# Patient Record
Sex: Female | Born: 1985 | Race: Black or African American | Hispanic: No | Marital: Single | Smoking: Former smoker
Health system: Southern US, Community
[De-identification: ages and names within clinical notes are randomized; demographics above are authoritative.]

## PROBLEM LIST (undated history)

## (undated) ENCOUNTER — Inpatient Hospital Stay (HOSPITAL_COMMUNITY): Payer: Self-pay

## (undated) DIAGNOSIS — G35 Multiple sclerosis: Secondary | ICD-10-CM

## (undated) DIAGNOSIS — N390 Urinary tract infection, site not specified: Secondary | ICD-10-CM

## (undated) DIAGNOSIS — A599 Trichomoniasis, unspecified: Secondary | ICD-10-CM

## (undated) DIAGNOSIS — K219 Gastro-esophageal reflux disease without esophagitis: Secondary | ICD-10-CM

## (undated) HISTORY — PX: TONSILLECTOMY: SUR1361

## (undated) HISTORY — DX: Multiple sclerosis: G35

## (undated) HISTORY — PX: ADENOIDECTOMY: SUR15

---

## 2005-03-21 ENCOUNTER — Ambulatory Visit: Payer: Self-pay | Admitting: Obstetrics and Gynecology

## 2008-07-27 ENCOUNTER — Emergency Department (HOSPITAL_COMMUNITY): Admission: EM | Admit: 2008-07-27 | Discharge: 2008-07-27 | Payer: Self-pay | Admitting: Family Medicine

## 2010-03-11 ENCOUNTER — Encounter: Payer: Self-pay | Admitting: *Deleted

## 2010-07-06 NOTE — Group Therapy Note (Signed)
NAME:  Anita [name], Anita [name] NO.:  [id number]   MEDICAL RECORD NO.:  [id number]          PATIENT TYPE:  WOC   LOCATION:  [facility]                   FACILITY:  [facility]   PHYSICIAN:  Argentina Donovan, MD        DATE OF BIRTH:  [birth year]   DATE OF SERVICE:                                    CLINIC NOTE   The patient is a 25 year old nulligravida black female weighing 265 pounds,  5 feet 6 inches tall, whose chief complaint is for somewhat of an irregular  period although her cycle sounds like it is more of a 35-day variance in  cycle and dyspareunia especially within two weeks of her period.  We have  discussed the possible causes of both of these things and I told her to keep  a very good record of her periods, as well as the episodes of painful  intercourse.   EXAMINATION:  External genitalia is normal.  BUS within normal limits.  The  vagina is clean and well rugated.  The cervix is anterior, clean and  nulliparous, and the uterus is retroverted and of normal size, shape and  consistency.  The adnexa could not be well outlined because of habitus of  the patient.   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   PLAN:  Positional change using a menstrual calendar and marking the dates of  pain, and suppressing ovulation with oral contraceptives although we may  have to change the oral contraceptive because of the habitus of the patient.           ______________________________  Argentina Donovan, MD     PR/MEDQ  D:  03/21/2005  T:  03/21/2005  Job:  [id number]

## 2011-11-20 ENCOUNTER — Encounter (HOSPITAL_COMMUNITY): Payer: Self-pay | Admitting: *Deleted

## 2011-11-20 ENCOUNTER — Emergency Department (HOSPITAL_COMMUNITY)
Admission: EM | Admit: 2011-11-20 | Discharge: 2011-11-20 | Disposition: A | Discharge status: Home / Self Care | Payer: Self-pay | Attending: Emergency Medicine | Admitting: Emergency Medicine

## 2011-11-20 DIAGNOSIS — K219 Gastro-esophageal reflux disease without esophagitis: Secondary | ICD-10-CM

## 2011-11-20 DIAGNOSIS — R197 Diarrhea, unspecified: Secondary | ICD-10-CM | POA: Insufficient documentation

## 2011-11-20 DIAGNOSIS — R1013 Epigastric pain: Secondary | ICD-10-CM | POA: Insufficient documentation

## 2011-11-20 DIAGNOSIS — K529 Noninfective gastroenteritis and colitis, unspecified: Secondary | ICD-10-CM

## 2011-11-20 DIAGNOSIS — R112 Nausea with vomiting, unspecified: Secondary | ICD-10-CM | POA: Insufficient documentation

## 2011-11-20 DIAGNOSIS — R509 Fever, unspecified: Secondary | ICD-10-CM | POA: Insufficient documentation

## 2011-11-20 LAB — URINALYSIS, MICROSCOPIC ONLY
Bilirubin Urine: NEGATIVE
Nitrite: NEGATIVE
Specific Gravity, Urine: 1.026 (ref 1.005–1.030)
pH: 5.5 (ref 5.0–8.0)

## 2011-11-20 LAB — CBC WITH DIFFERENTIAL/PLATELET
Basophils Relative: 0 % (ref 0–1)
Eosinophils Absolute: 0.4 10*3/uL (ref 0.0–0.7)
MCH: 22.5 pg — ABNORMAL LOW (ref 26.0–34.0)
MCHC: 31.9 g/dL (ref 30.0–36.0)
Monocytes Relative: 7 % (ref 3–12)
Neutrophils Relative %: 51 % (ref 43–77)
Platelets: 342 10*3/uL (ref 150–400)
RDW: 15.1 % (ref 11.5–15.5)

## 2011-11-20 LAB — COMPREHENSIVE METABOLIC PANEL
Albumin: 3 g/dL — ABNORMAL LOW (ref 3.5–5.2)
Alkaline Phosphatase: 59 U/L (ref 39–117)
BUN: 11 mg/dL (ref 6–23)
Potassium: 3.8 mEq/L (ref 3.5–5.1)
Sodium: 138 mEq/L (ref 135–145)
Total Protein: 6.1 g/dL (ref 6.0–8.3)

## 2011-11-20 LAB — LIPASE, BLOOD: Lipase: 34 U/L (ref 11–59)

## 2011-11-20 MED ORDER — ONDANSETRON HCL 4 MG PO TABS: 4.0000 mg | ORAL_TABLET | Freq: Four times a day (QID) | ORAL | Status: DC

## 2011-11-20 MED ORDER — ONDANSETRON 8 MG PO TBDP
8.0000 mg | ORAL_TABLET | Freq: Once | ORAL | Status: AC
  Administered 2011-11-20: 8 mg via ORAL
  Filled 2011-11-20: qty 1

## 2011-11-20 MED ORDER — RANITIDINE HCL 150 MG PO CAPS: 150.0000 mg | ORAL_CAPSULE | Freq: Every day | ORAL | Status: DC

## 2011-11-20 NOTE — ED Provider Notes (Signed)
History     CSN: [id number]  Arrival date & time 11/20/11  1844   First MD Initiated Contact with Patient 11/20/11 2101      Chief Complaint  Patient presents with  . Nausea  . Emesis  . Diarrhea  . Abdominal Cramping    (Consider location/radiation/quality/duration/timing/severity/associated sxs/prior treatment) Patient is a 26 y.o. female presenting with vomiting, diarrhea, and cramps. The history is provided by the patient.  Emesis  This is a new problem. The current episode started 2 days ago. The problem occurs 2 to 4 times per day. The problem has been resolved. The emesis has an appearance of stomach contents. There has been no fever. Associated symptoms include chills and diarrhea. Pertinent negatives include no cough, no fever and no URI. Risk factors: possible ill contacts.  Diarrhea The primary symptoms include nausea, vomiting and diarrhea. Primary symptoms do not include fever. The illness began 2 days ago. The onset was sudden. The problem has been gradually improving.  The diarrhea is watery. The diarrhea occurs 2 to 4 times per day.  The illness is also significant for chills and anorexia. The illness does not include constipation or back pain. Significant associated medical issues include GERD.  Abdominal Cramping The primary symptoms of the illness include nausea, vomiting and diarrhea. The primary symptoms of the illness do not include fever.  Additional symptoms associated with the illness include chills and anorexia. Symptoms associated with the illness do not include constipation or back pain. Significant associated medical issues include GERD.    History reviewed. No pertinent past medical history.  Past Surgical History  Procedure Date  . Tonsillectomy   . Adenoidectomy     No family history on file.  History  Substance Use Topics  . Smoking status: Current Some Day Smoker  . Smokeless tobacco: Not on file  . Alcohol Use: No    OB History    Grav Para Term Preterm Abortions TAB SAB Ect Mult Living                  Review of Systems  Constitutional: Positive for chills. Negative for fever.  Respiratory: Negative for cough.   Gastrointestinal: Positive for nausea, vomiting, diarrhea and anorexia. Negative for constipation.  Musculoskeletal: Negative for back pain.  All other systems reviewed and are negative.    Allergies  Amoxicillin  Home Medications  No current outpatient prescriptions on file.  BP 106/60  Pulse 78  Temp 99.1 F (37.3 C) (Oral)  Resp 18  SpO2 100%  LMP 10/30/2011  Physical Exam  Nursing note and vitals reviewed. Constitutional: She is oriented to person, place, and time. She appears well-developed and well-nourished. No distress.  HENT:  Head: Normocephalic and atraumatic.  Mouth/Throat: Oropharynx is clear and moist.  Eyes: Conjunctivae normal and EOM are normal. Pupils are equal, round, and reactive to light.  Neck: Normal range of motion. Neck supple.  Cardiovascular: Normal rate, regular rhythm and intact distal pulses.   No murmur heard. Pulmonary/Chest: Effort normal and breath sounds normal. No respiratory distress. She has no wheezes. She has no rales.  Abdominal: Soft. She exhibits no distension. There is tenderness in the epigastric area. There is no rebound and no guarding.       Mild epigastric pain  Musculoskeletal: Normal range of motion. She exhibits no edema and no tenderness.  Neurological: She is alert and oriented to person, place, and time.  Skin: Skin is warm and dry. No rash noted. No erythema.  Psychiatric: She has a normal mood and affect. Her behavior is normal.    ED Course  Procedures (including critical care time)  Labs Reviewed  CBC WITH DIFFERENTIAL - Abnormal; Notable for the following:    Hemoglobin 11.3 (*)     HCT 35.4 (*)     MCV 70.5 (*)     MCH 22.5 (*)     All other components within normal limits  COMPREHENSIVE METABOLIC PANEL - Abnormal;  Notable for the following:    Albumin 3.0 (*)     Total Bilirubin 0.1 (*)     GFR calc non Af Amer 87 (*)     All other components within normal limits  URINALYSIS, MICROSCOPIC ONLY - Abnormal; Notable for the following:    APPearance CLOUDY (*)     Leukocytes, UA SMALL (*)     Bacteria, UA FEW (*)     Squamous Epithelial / LPF MANY (*)     All other components within normal limits  LIPASE, BLOOD  PREGNANCY, URINE   No results found.   No diagnosis found.    MDM   Pt with symptoms most consistent with a viral process with fever/vomitting/diarrhea.  Denies bad food exposure and recent travel out of the country.  No recent abx.  No hx concerning for GU pathology or kidney stones.  Pt is awake and alert on exam without peritoneal signs.  Labs all wnl.  Pt not c/o of UTI sx.  Pt wanted to try po zofran and will fluid challenge.  Also pt c/o of sx consistent with reflux and states has had it before but worse since this illness.  Will have her take a 2 week course of zantac.  10:29 PM Pt feeling much better and d/ced home.      Gwyneth Sprout, MD 11/20/11 2229

## 2011-11-20 NOTE — ED Notes (Signed)
Patient reports N/V starting Monday night.  Pt reports vomiting x1 late Monday night. Pt reports yesterday evening started to have N/V/D.  Pt also reports acid reflux. Pt has reports symptoms have improved today and she has been able to keep fluids down. Pt reports ginger ale seems to improve her stomach discomfort. Pt not actively vomiting in triage,

## 2012-02-10 ENCOUNTER — Emergency Department (HOSPITAL_COMMUNITY): Payer: Self-pay

## 2012-02-10 ENCOUNTER — Emergency Department (HOSPITAL_COMMUNITY)
Admission: EM | Admit: 2012-02-10 | Discharge: 2012-02-11 | Disposition: A | Discharge status: Home / Self Care | Payer: Self-pay | Attending: Emergency Medicine | Admitting: Emergency Medicine

## 2012-02-10 ENCOUNTER — Encounter (HOSPITAL_COMMUNITY): Payer: Self-pay | Admitting: Emergency Medicine

## 2012-02-10 DIAGNOSIS — Z8669 Personal history of other diseases of the nervous system and sense organs: Secondary | ICD-10-CM | POA: Insufficient documentation

## 2012-02-10 DIAGNOSIS — R51 Headache: Secondary | ICD-10-CM | POA: Insufficient documentation

## 2012-02-10 DIAGNOSIS — F17200 Nicotine dependence, unspecified, uncomplicated: Secondary | ICD-10-CM | POA: Insufficient documentation

## 2012-02-10 DIAGNOSIS — Z79899 Other long term (current) drug therapy: Secondary | ICD-10-CM | POA: Insufficient documentation

## 2012-02-10 DIAGNOSIS — H539 Unspecified visual disturbance: Secondary | ICD-10-CM | POA: Insufficient documentation

## 2012-02-10 LAB — CSF CELL COUNT WITH DIFFERENTIAL
RBC Count, CSF: 1 /mm3 — ABNORMAL HIGH
Tube #: 4
WBC, CSF: 2 /mm3 (ref 0–5)
WBC, CSF: 3 /mm3 (ref 0–5)

## 2012-02-10 LAB — GRAM STAIN

## 2012-02-10 LAB — CBC WITH DIFFERENTIAL/PLATELET
Basophils Relative: 1 % (ref 0–1)
Eosinophils Absolute: 0.3 10*3/uL (ref 0.0–0.7)
MCH: 22.3 pg — ABNORMAL LOW (ref 26.0–34.0)
MCHC: 31.9 g/dL (ref 30.0–36.0)
Neutrophils Relative %: 52 % (ref 43–77)
Platelets: 371 10*3/uL (ref 150–400)
RDW: 15.2 % (ref 11.5–15.5)

## 2012-02-10 LAB — BASIC METABOLIC PANEL
Calcium: 8.1 mg/dL — ABNORMAL LOW (ref 8.4–10.5)
Creatinine, Ser: 0.9 mg/dL (ref 0.50–1.10)
GFR calc non Af Amer: 86 mL/min — ABNORMAL LOW (ref >90)
Sodium: 134 mEq/L — ABNORMAL LOW (ref 135–145)

## 2012-02-10 LAB — PROTEIN AND GLUCOSE, CSF: Glucose, CSF: 56 mg/dL (ref 43–76)

## 2012-02-10 MED ORDER — IOHEXOL 300 MG/ML  SOLN
100.0000 mL | Freq: Once | INTRAMUSCULAR | Status: AC | PRN
  Administered 2012-02-10: 80 mL via INTRAVENOUS

## 2012-02-10 NOTE — ED Notes (Signed)
Patient transported to X-ray for lumbar puncture  

## 2012-02-10 NOTE — ED Notes (Signed)
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

## 2012-02-10 NOTE — Procedures (Signed)
LP under fluoro.  OP 10 cm of water.  Fluid clear without hemorrhage or xanthochromia.

## 2012-02-11 NOTE — ED Provider Notes (Signed)
History     CSN: [id number]  Arrival date & time 02/10/12  8657   First MD Initiated Contact with Patient 02/10/12 1902      Chief Complaint  Patient presents with  . vision changes     (Consider location/radiation/quality/duration/timing/severity/associated sxs/prior treatment) Patient is a 26 y.o. female presenting with headaches.  Headache  This is a new problem. The current episode started 2 days ago. The problem has been gradually improving. The pain is located in the left unilateral region. The quality of the pain is described as throbbing. The pain is mild. The pain does not radiate. She has tried nothing for the symptoms.  Patient with history of pseudotumor cerebri, seen previously several years ago at [facility]  Two days ago felt a "pop" behind left eye with headache and left visual deficit (described as field of vision being "dark" out of left eye).  History reviewed. No pertinent past medical history.  Past Surgical History  Procedure Date  . Tonsillectomy   . Adenoidectomy     No family history on file.  History  Substance Use Topics  . Smoking status: Current Some Day Smoker  . Smokeless tobacco: Not on file  . Alcohol Use: No    OB History    Grav Para Term Preterm Abortions TAB SAB Ect Mult Living                  Review of Systems  HENT: Negative for neck stiffness.   Eyes: Positive for visual disturbance.  Neurological: Positive for headaches. Negative for speech difficulty.  All other systems reviewed and are negative.    Allergies  Amoxicillin  Home Medications   Current Outpatient Rx  Name  Route  Sig  Dispense  Refill  . ONDANSETRON HCL 4 MG PO TABS   Oral   Take 1 tablet (4 mg total) by mouth every 6 (six) hours.   12 tablet   0   . RANITIDINE HCL 150 MG PO CAPS   Oral   Take 1 capsule (150 mg total) by mouth daily.   28 capsule   0     BP 118/68  Pulse 64  Temp 98.5 F (36.9 C) (Oral)  Resp 16  SpO2 100%  LMP  01/26/2012  Physical Exam  Nursing note and vitals reviewed. Constitutional: She is oriented to person, place, and time. She appears well-developed and well-nourished.  HENT:  Head: Normocephalic and atraumatic.  Eyes: Conjunctivae normal and EOM are normal. Pupils are equal, round, and reactive to light.  Fundoscopic exam:      The left eye shows no exudate, no hemorrhage and no papilledema. The left eye shows red reflex. Neck: Normal range of motion. Neck supple.  Cardiovascular: Normal rate, regular rhythm and normal heart sounds.   Pulmonary/Chest: Effort normal and breath sounds normal.  Abdominal: Soft. Bowel sounds are normal.  Musculoskeletal: Normal range of motion.  Neurological: She is alert and oriented to person, place, and time. No cranial nerve deficit.  Skin: Skin is warm and dry.  Psychiatric: She has a normal mood and affect. Her behavior is normal. Judgment and thought content normal.    ED Course  Procedures (including critical care time)   Labs Reviewed  CBC WITH DIFFERENTIAL - Abnormal; Notable for the following:    RBC 5.48 (*)     MCV 69.9 (*)     MCH 22.3 (*)     All other components within normal limits  BASIC METABOLIC PANEL -  Abnormal; Notable for the following:    Sodium 134 (*)     Calcium 8.1 (*)     GFR calc non Af Amer 86 (*)     All other components within normal limits  CSF CELL COUNT WITH DIFFERENTIAL - Abnormal; Notable for the following:    RBC Count, CSF 2 (*)     All other components within normal limits  CSF CELL COUNT WITH DIFFERENTIAL - Abnormal; Notable for the following:    RBC Count, CSF 1 (*)     All other components within normal limits  PROTEIN AND GLUCOSE, CSF  GRAM STAIN  CSF CULTURE   Ct Head W Wo Contrast  02/10/2012  *RADIOLOGY REPORT*  Clinical Data: Headache with visual disturbance.  History of pseudotumor cerebral.  CT HEAD WITHOUT AND WITH CONTRAST  Technique:  Contiguous axial images were obtained from the base  of the skull through the vertex without and with intravenous contrast.  Contrast: 80mL OMNIPAQUE IOHEXOL 300 MG/ML  SOLN  Comparison: None.  Findings: There is no evidence for acute infarction, intracranial hemorrhage, mass lesion, hydrocephalus, or extra-axial fluid. There is no atrophy or white matter disease.  Following administration of contrast, there is no abnormal enhancement.  Calvarium intact.  Sinuses and mastoids clear.  IMPRESSION: Negative CT of the head without and with contrast.   Original Report Authenticated By: Davonna Belling, M.D.    Dg Fluoro Guide Lumbar Puncture  02/10/2012  Elsie Stain, MD     02/10/2012 10:02 PM LP under fluoro.  OP 10 cm of water.  Fluid clear without  hemorrhage or xanthochromia.   Discussed with Dr. Manus Gunning.  Recommends CT of head, LP, with concern for [facility].  Lab and radiology results reviewed, discussed with Dr. Manus Gunning, shared with patient.  1. Headache behind the eyes       MDM          Jimmye Norman, NP 02/11/12 0110

## 2012-02-11 NOTE — ED Provider Notes (Signed)
Medical screening examination/treatment/procedure(s) were conducted as a shared visit with non-physician practitioner(s) and myself.  I personally evaluated the patient during the encounter  Hx pseudotumor.  Felt "pop" behind L eye 2 days ago with severe headache. Feels like L eye vision is "darker".  Headache has improved. CN 2-12 intact, 5/5 strenght throughout, no ataxia on finger to nose, visual fields full confrontation.  Plan CT/LP with opening pressure.  Glynn Octave, MD 02/11/12 [phone]

## 2012-02-14 LAB — CSF CULTURE W GRAM STAIN
Gram Stain: NONE SEEN
Special Requests: NORMAL

## 2012-07-30 ENCOUNTER — Emergency Department (HOSPITAL_COMMUNITY)
Admission: EM | Admit: 2012-07-30 | Discharge: 2012-07-30 | Disposition: A | Discharge status: Home / Self Care | Payer: Medicaid Other | Attending: Emergency Medicine | Admitting: Emergency Medicine

## 2012-07-30 ENCOUNTER — Encounter (HOSPITAL_COMMUNITY): Payer: Self-pay | Admitting: Emergency Medicine

## 2012-07-30 DIAGNOSIS — O99330 Smoking (tobacco) complicating pregnancy, unspecified trimester: Secondary | ICD-10-CM | POA: Insufficient documentation

## 2012-07-30 DIAGNOSIS — R42 Dizziness and giddiness: Secondary | ICD-10-CM | POA: Insufficient documentation

## 2012-07-30 DIAGNOSIS — Z880 Allergy status to penicillin: Secondary | ICD-10-CM | POA: Insufficient documentation

## 2012-07-30 DIAGNOSIS — O9989 Other specified diseases and conditions complicating pregnancy, childbirth and the puerperium: Secondary | ICD-10-CM | POA: Insufficient documentation

## 2012-07-30 DIAGNOSIS — Z3201 Encounter for pregnancy test, result positive: Secondary | ICD-10-CM | POA: Insufficient documentation

## 2012-07-30 DIAGNOSIS — R5383 Other fatigue: Secondary | ICD-10-CM | POA: Insufficient documentation

## 2012-07-30 DIAGNOSIS — R110 Nausea: Secondary | ICD-10-CM

## 2012-07-30 DIAGNOSIS — Z8719 Personal history of other diseases of the digestive system: Secondary | ICD-10-CM | POA: Insufficient documentation

## 2012-07-30 DIAGNOSIS — R5381 Other malaise: Secondary | ICD-10-CM | POA: Insufficient documentation

## 2012-07-30 DIAGNOSIS — N390 Urinary tract infection, site not specified: Secondary | ICD-10-CM | POA: Insufficient documentation

## 2012-07-30 DIAGNOSIS — O2341 Unspecified infection of urinary tract in pregnancy, first trimester: Secondary | ICD-10-CM

## 2012-07-30 DIAGNOSIS — K5900 Constipation, unspecified: Secondary | ICD-10-CM | POA: Insufficient documentation

## 2012-07-30 DIAGNOSIS — O219 Vomiting of pregnancy, unspecified: Secondary | ICD-10-CM | POA: Insufficient documentation

## 2012-07-30 DIAGNOSIS — O2390 Unspecified genitourinary tract infection in pregnancy, unspecified trimester: Secondary | ICD-10-CM | POA: Insufficient documentation

## 2012-07-30 DIAGNOSIS — Z79899 Other long term (current) drug therapy: Secondary | ICD-10-CM | POA: Insufficient documentation

## 2012-07-30 HISTORY — DX: Gastro-esophageal reflux disease without esophagitis: K21.9

## 2012-07-30 LAB — POCT I-STAT, CHEM 8
Chloride: 102 mEq/L (ref 96–112)
HCT: 36 % (ref 36.0–46.0)
Hemoglobin: 12.2 g/dL (ref 12.0–15.0)
Potassium: 3.4 mEq/L — ABNORMAL LOW (ref 3.5–5.1)

## 2012-07-30 LAB — URINALYSIS, ROUTINE W REFLEX MICROSCOPIC
Glucose, UA: NEGATIVE mg/dL
Specific Gravity, Urine: 1.031 — ABNORMAL HIGH (ref 1.005–1.030)
Urobilinogen, UA: 1 mg/dL (ref 0.0–1.0)

## 2012-07-30 LAB — CBC WITH DIFFERENTIAL/PLATELET
Basophils Relative: 0 % (ref 0–1)
Eosinophils Relative: 1 % (ref 0–5)
HCT: 33.1 % — ABNORMAL LOW (ref 36.0–46.0)
Hemoglobin: 10.9 g/dL — ABNORMAL LOW (ref 12.0–15.0)
Lymphs Abs: 1.7 10*3/uL (ref 0.7–4.0)
MCH: 22.7 pg — ABNORMAL LOW (ref 26.0–34.0)
MCV: 68.8 fL — ABNORMAL LOW (ref 78.0–100.0)
Monocytes Absolute: 0.4 10*3/uL (ref 0.1–1.0)
Monocytes Relative: 6 % (ref 3–12)
Neutro Abs: 4 10*3/uL (ref 1.7–7.7)
RBC: 4.81 MIL/uL (ref 3.87–5.11)
WBC: 6.2 10*3/uL (ref 4.0–10.5)

## 2012-07-30 LAB — LIPASE, BLOOD: Lipase: 22 U/L (ref 11–59)

## 2012-07-30 LAB — COMPREHENSIVE METABOLIC PANEL
BUN: 4 mg/dL — ABNORMAL LOW (ref 6–23)
Calcium: 9 mg/dL (ref 8.4–10.5)
Creatinine, Ser: 0.6 mg/dL (ref 0.50–1.10)
GFR calc Af Amer: >90 mL/min (ref >90)
Glucose, Bld: 92 mg/dL (ref 70–99)
Total Protein: 7 g/dL (ref 6.0–8.3)

## 2012-07-30 LAB — URINE MICROSCOPIC-ADD ON

## 2012-07-30 LAB — HCG, QUANTITATIVE, PREGNANCY: hCG, Beta Chain, Quant, S: 107124 m[IU]/mL — ABNORMAL HIGH (ref <5)

## 2012-07-30 MED ORDER — ONDANSETRON 8 MG PO TBDP
8.0000 mg | ORAL_TABLET | Freq: Once | ORAL | Status: AC
  Administered 2012-07-30: 8 mg via ORAL
  Filled 2012-07-30: qty 1

## 2012-07-30 MED ORDER — SODIUM CHLORIDE 0.9 % IV BOLUS (SEPSIS)
1000.0000 mL | Freq: Once | INTRAVENOUS | Status: AC
  Administered 2012-07-30: 1000 mL via INTRAVENOUS

## 2012-07-30 MED ORDER — PRENATAL COMPLETE 14-0.4 MG PO TABS: 1.0000 | ORAL_TABLET | Freq: Every day | ORAL | Status: DC

## 2012-07-30 MED ORDER — PRENATAL MULTIVITAMIN CH
1.0000 | ORAL_TABLET | Freq: Every day | ORAL | Status: DC
  Filled 2012-07-30: qty 1

## 2012-07-30 MED ORDER — CEPHALEXIN 500 MG PO CAPS: 500.0000 mg | ORAL_CAPSULE | Freq: Two times a day (BID) | ORAL | Status: DC

## 2012-07-30 MED ORDER — LORAZEPAM 2 MG/ML IJ SOLN: 0.5000 mg | Freq: Once | INTRAMUSCULAR | Status: DC

## 2012-07-30 MED ORDER — ONDANSETRON 4 MG PO TBDP: 4.0000 mg | ORAL_TABLET | Freq: Three times a day (TID) | ORAL | Status: DC | PRN

## 2012-07-30 NOTE — Progress Notes (Signed)
[facility] has seen patient and provided her with a oc application and a list of primary care resources. °

## 2012-07-30 NOTE — ED Notes (Signed)
PA at bedside.

## 2012-07-30 NOTE — ED Provider Notes (Signed)
History     CSN: [id number]  Arrival date & time 07/30/12  1028   First MD Initiated Contact with Patient 07/30/12 1037      Chief Complaint  Patient presents with  . Dizziness  . Emesis    (Consider location/radiation/quality/duration/timing/severity/associated sxs/prior treatment) HPI Anita [name] is a 27 y.o. female who presents to ED with complaint of dizziness, nausea, vomiting, weakness. States symptoms began several weeks ago with nausea and intermittent constipation. States in the last few days began having dizziness. States works at a [employer] with no air conditioning. Thinks may be dehydrated. States also took pregnancy test at home which showed "a little bit of negative and a little bit of positive" result. State dizziness feels like "lightheadiness" independent of position. Denies cough, fever, chills, URI symptoms, denies chest pain or abdominal pain, no syncope.    Past Medical History  Diagnosis Date  . GERD (gastroesophageal reflux disease)     Past Surgical History  Procedure Laterality Date  . Tonsillectomy    . Adenoidectomy      No family history on file.  History  Substance Use Topics  . Smoking status: Current Some Day Smoker  . Smokeless tobacco: Not on file  . Alcohol Use: No    OB History   Grav Para Term Preterm Abortions TAB SAB Ect Mult Living                  Review of Systems  Constitutional: Positive for fatigue. Negative for fever and chills.  HENT: Negative for neck pain.   Respiratory: Negative.   Cardiovascular: Negative.   Gastrointestinal: Positive for nausea, vomiting and constipation. Negative for abdominal pain, diarrhea and blood in stool.  Genitourinary: Negative for dysuria, flank pain, vaginal bleeding and vaginal discharge.  Skin: Negative.   Neurological: Positive for dizziness, weakness and light-headedness. Negative for syncope and headaches.  All other systems reviewed and are  negative.    Allergies  Amoxicillin  Home Medications   Current Outpatient Rx  Name  Route  Sig  Dispense  Refill  . diphenhydrAMINE (BENADRYL) 25 MG tablet   Oral   Take 25 mg by mouth every 6 (six) hours as needed for sleep.         . Probiotic Product (PROBIOTIC & ACIDOPHILUS EX ST PO)   Oral   Take 1 capsule by mouth daily.           BP 120/91  Pulse 101  Temp(Src) 98.3 F (36.8 C) (Oral)  Resp 18  SpO2 100%  LMP 06/20/2012  Physical Exam  Nursing note and vitals reviewed. Constitutional: She is oriented to person, place, and time. She appears well-developed and well-nourished. No distress.  Eyes: Conjunctivae are normal.  Neck: Neck supple.  Cardiovascular: Normal rate, regular rhythm and normal heart sounds.   Pulmonary/Chest: Effort normal and breath sounds normal. No respiratory distress. She has no wheezes. She has no rales.  Abdominal: Soft. Bowel sounds are normal. She exhibits no distension. There is no tenderness. There is no rebound.  Musculoskeletal: She exhibits no edema.  Neurological: She is alert and oriented to person, place, and time.  Skin: Skin is warm and dry.  Psychiatric: She has a normal mood and affect.    ED Course  Procedures (including critical care time)  Pt with dizziness, nausea for several days. Problem with bowel movements. No abdominal pain. No urinary or vaginal complaints. No chest pain or sob. Will get labs, urine, preg  Results for orders placed during the hospital encounter of 07/30/12  URINALYSIS, ROUTINE W REFLEX MICROSCOPIC      Result Value Range   Color, Urine AMBER (*) YELLOW   APPearance CLOUDY (*) CLEAR   Specific Gravity, Urine 1.031 (*) 1.005 - 1.030   pH 5.5  5.0 - 8.0   Glucose, UA NEGATIVE  NEGATIVE mg/dL   Hgb urine dipstick SMALL (*) NEGATIVE   Bilirubin Urine SMALL (*) NEGATIVE   Ketones, ur 40 (*) NEGATIVE mg/dL   Protein, ur 30 (*) NEGATIVE mg/dL   Urobilinogen, UA 1.0  0.0 - 1.0 mg/dL    Nitrite POSITIVE (*) NEGATIVE   Leukocytes, UA MODERATE (*) NEGATIVE  CBC WITH DIFFERENTIAL      Result Value Range   WBC 6.2  4.0 - 10.5 K/uL   RBC 4.81  3.87 - 5.11 MIL/uL   Hemoglobin 10.9 (*) 12.0 - 15.0 g/dL   HCT 16.1 (*) 09.6 - 04.5 %   MCV 68.8 (*) 78.0 - 100.0 fL   MCH 22.7 (*) 26.0 - 34.0 pg   MCHC 32.9  30.0 - 36.0 g/dL   RDW 40.9  81.1 - 91.4 %   Platelets 345  150 - 400 K/uL   Neutrophils Relative % 66  43 - 77 %   Lymphocytes Relative 27  12 - 46 %   Monocytes Relative 6  3 - 12 %   Eosinophils Relative 1  0 - 5 %   Basophils Relative 0  0 - 1 %   Neutro Abs 4.0  1.7 - 7.7 K/uL   Lymphs Abs 1.7  0.7 - 4.0 K/uL   Monocytes Absolute 0.4  0.1 - 1.0 K/uL   Eosinophils Absolute 0.1  0.0 - 0.7 K/uL   Basophils Absolute 0.0  0.0 - 0.1 K/uL   Smear Review MORPHOLOGY UNREMARKABLE    COMPREHENSIVE METABOLIC PANEL      Result Value Range   Sodium 130 (*) 135 - 145 mEq/L   Potassium 3.3 (*) 3.5 - 5.1 mEq/L   Chloride 98  96 - 112 mEq/L   CO2 22  19 - 32 mEq/L   Glucose, Bld 92  70 - 99 mg/dL   BUN 4 (*) 6 - 23 mg/dL   Creatinine, Ser 7.82  0.50 - 1.10 mg/dL   Calcium 9.0  8.4 - 95.6 mg/dL   Total Protein 7.0  6.0 - 8.3 g/dL   Albumin 3.1 (*) 3.5 - 5.2 g/dL   AST 16  0 - 37 U/L   ALT 33  0 - 35 U/L   Alkaline Phosphatase 68  39 - 117 U/L   Total Bilirubin 0.2 (*) 0.3 - 1.2 mg/dL   GFR calc non Af Amer >90  >90 mL/min   GFR calc Af Amer >90  >90 mL/min  LIPASE, BLOOD      Result Value Range   Lipase 22  11 - 59 U/L  HCG, QUANTITATIVE, PREGNANCY      Result Value Range   hCG, Beta Chain, [place] Longest 213086 (*) <5 mIU/mL  URINE MICROSCOPIC-ADD ON      Result Value Range   Squamous Epithelial / LPF FEW (*) RARE   WBC, UA 7-10  <3 WBC/hpf   RBC / HPF 3-6  <3 RBC/hpf   Bacteria, UA MANY (*) RARE   Urine-Other MUCOUS PRESENT    POCT I-STAT, CHEM 8      Result Value Range   Sodium 135  135 - 145  mEq/L   Potassium 3.4 (*) 3.5 - 5.1 mEq/L   Chloride 102  96 - 112 mEq/L    BUN <3 (*) 6 - 23 mg/dL   Creatinine, Ser 4.09  0.50 - 1.10 mg/dL   Glucose, Bld 89  70 - 99 mg/dL   Calcium, Ion 8.11  9.14 - 1.23 mmol/L   TCO2 24  0 - 100 mmol/L   Hemoglobin 12.2  12.0 - 15.0 g/dL   HCT 78.2  95.6 - 21.3 %  POCT PREGNANCY, URINE      Result Value Range   Preg Test, Ur POSITIVE (*) NEGATIVE   No results found.    1. Positive pregnancy test   2. Nausea   3. Dizziness   4. UTI (urinary tract infection) during pregnancy, first trimester       MDM  Pt's urine and quant hcg back positive for pregnancy. Pt denies abdominal pain or vaginal discharge or bleeding. Do not think pelvic exam or [place] indicated at this time. Doubt any pregnancy related problems such as miscarriage or ectopic. Pt's last normal period was April 3rd which is exactly 10 wks ago, and correlates with hCG of 107,124. I do believe pt's nausea, weakness is due to her pregnancy. Her VS here are normal. UA shows infection and mild dehydration. Pt hydrated with 2L of IV fluids in ED. Home with keflex, zofran and prenatal vitamins. Follow up with GYN.  Filed Vitals:   07/30/12 1045 07/30/12 1103 07/30/12 1104 07/30/12 1106  BP: 125/73 113/58 118/81 120/91  Pulse: 77 76 82 101  Temp: 98.3 F (36.8 C)     TempSrc: Oral     Resp: 18     SpO2: 100%                Zela Sobieski A Kareli Hossain, PA-C 07/30/12 1345

## 2012-07-30 NOTE — ED Notes (Signed)
Pt here for c/o lightheaded/dizziness with one episode of emesis while at work, no bm in three days

## 2012-07-31 LAB — URINE CULTURE: Colony Count: 75000

## 2012-07-31 NOTE — ED Provider Notes (Signed)
Medical screening examination/treatment/procedure(s) were performed by non-physician practitioner and as supervising physician I was immediately available for consultation/collaboration.   Allissa Albright L Charnell Peplinski, MD 07/31/12 0917 

## 2012-08-03 ENCOUNTER — Inpatient Hospital Stay (HOSPITAL_COMMUNITY): Admit: 2012-08-03 | Payer: Self-pay | Admitting: Obstetrics & Gynecology

## 2012-09-09 ENCOUNTER — Encounter (HOSPITAL_COMMUNITY): Payer: Self-pay | Admitting: *Deleted

## 2012-09-09 ENCOUNTER — Inpatient Hospital Stay (HOSPITAL_COMMUNITY)
Admission: AD | Admit: 2012-09-09 | Discharge: 2012-09-09 | Disposition: A | Discharge status: Home / Self Care | Payer: Medicaid Other | Source: Ambulatory Visit | Attending: Obstetrics & Gynecology | Admitting: Obstetrics & Gynecology

## 2012-09-09 DIAGNOSIS — R109 Unspecified abdominal pain: Secondary | ICD-10-CM | POA: Insufficient documentation

## 2012-09-09 DIAGNOSIS — A088 Other specified intestinal infections: Secondary | ICD-10-CM

## 2012-09-09 DIAGNOSIS — A084 Viral intestinal infection, unspecified: Secondary | ICD-10-CM

## 2012-09-09 DIAGNOSIS — O99891 Other specified diseases and conditions complicating pregnancy: Secondary | ICD-10-CM | POA: Insufficient documentation

## 2012-09-09 DIAGNOSIS — R197 Diarrhea, unspecified: Secondary | ICD-10-CM | POA: Insufficient documentation

## 2012-09-09 LAB — URINALYSIS, ROUTINE W REFLEX MICROSCOPIC
Bilirubin Urine: NEGATIVE
Glucose, UA: NEGATIVE mg/dL
Nitrite: NEGATIVE
Specific Gravity, Urine: >1.03 — ABNORMAL HIGH (ref 1.005–1.030)
pH: 6 (ref 5.0–8.0)

## 2012-09-09 LAB — CBC
MCHC: 33.3 g/dL (ref 30.0–36.0)
RDW: 15.4 % (ref 11.5–15.5)

## 2012-09-09 LAB — URINE MICROSCOPIC-ADD ON

## 2012-09-09 MED ORDER — DEXTROSE 5 % IN LACTATED RINGERS IV BOLUS
1000.0000 mL | Freq: Once | INTRAVENOUS | Status: AC
  Administered 2012-09-09: 1000 mL via INTRAVENOUS

## 2012-09-09 MED ORDER — FAMOTIDINE IN NACL 20-0.9 MG/50ML-% IV SOLN
20.0000 mg | Freq: Once | INTRAVENOUS | Status: AC
  Administered 2012-09-09: 20 mg via INTRAVENOUS
  Filled 2012-09-09: qty 50

## 2012-09-09 NOTE — MAU Note (Signed)
Patient states she has had no prenatal care. Has had diarrhea for 3 days with abdominal pain. Denies vomiting.

## 2012-09-09 NOTE — MAU Provider Note (Signed)
  History     CSN: [id number]  Arrival date and time: 09/09/12 1201   None     Chief Complaint  Patient presents with  . Diarrhea  . Abdominal Pain   HPI Pt is [redacted]w[redacted]d pregnant and presents with with decreased appetite and diarrhea about 3 times daily in the morning for 3 days.  Pt has hx of acid reflux but has not had a problem since she has been pregnant.  Pt has an appointment at [facility] for [facility] care in 2 weeks.  Pt denies spotting, vaginal bleeding or UTI sx.  Pt has abdominal cramping later in the evening.  Pt's last bowel movement was this morning at 11:30am- goes from watery to loose and back to watery.  She has not vomited.  Pt has not had a fever or chills.  Pt is not aware of other people or family members having diarrhea.  Pt works at [employer].    Past Medical History  Diagnosis Date  . GERD (gastroesophageal reflux disease)     Past Surgical History  Procedure Laterality Date  . Tonsillectomy    . Adenoidectomy      No family history on file.  History  Substance Use Topics  . Smoking status: Current Some Day Smoker  . Smokeless tobacco: Not on file  . Alcohol Use: No    Allergies:  Allergies  Allergen Reactions  . Amoxicillin Other (See Comments)    Childhood reaction, doesn't remember    Prescriptions prior to admission  Medication Sig Dispense Refill  . cephALEXin (KEFLEX) 500 MG capsule Take 1 capsule (500 mg total) by mouth 2 (two) times daily.  14 capsule  0  . diphenhydrAMINE (BENADRYL) 25 MG tablet Take 25 mg by mouth every 6 (six) hours as needed for sleep.      Marland Kitchen ondansetron (ZOFRAN ODT) 4 MG disintegrating tablet Take 1 tablet (4 mg total) by mouth every 8 (eight) hours as needed for nausea.  20 tablet  0  . Prenatal Vit-Fe Fumarate-FA (PRENATAL COMPLETE) 14-0.4 MG TABS Take 1 tablet by mouth daily.  60 each  2  . Probiotic Product (PROBIOTIC & ACIDOPHILUS EX ST PO) Take 1 capsule by mouth daily.        Review of Systems  Constitutional: Negative  for fever and chills.  Gastrointestinal: Positive for nausea, abdominal pain and diarrhea. Negative for vomiting and constipation.  Genitourinary: Negative for dysuria and urgency.   Physical Exam   Height 5\' 5"  (1.651 m), weight 114.488 kg (252 lb 6.4 oz).  Physical Exam  Nursing note and vitals reviewed. Constitutional: She is oriented to person, place, and time. She appears well-developed and well-nourished. No distress.  HENT:  Head: Normocephalic.  Eyes: Pupils are equal, round, and reactive to light.  Neck: Normal range of motion. Neck supple.  Cardiovascular: Normal rate.   Respiratory: Effort normal.  GI: Soft. Bowel sounds are normal. She exhibits no distension. There is no tenderness. There is no rebound.  Musculoskeletal: Normal range of motion.  Neurological: She is alert and oriented to person, place, and time.  Skin: Skin is warm and dry.  Psychiatric: She has a normal mood and affect.    MAU Course  Procedures IVF D5LR  1 liter and Pepcid 20mg  IV given Pt felt much better after IVF and Pepcid No diarrhea since in MAU  Assessment and Plan  Viral gastroenteritis- BRAT diet F/u with scheduled OB appointment  Jess Sulak 09/09/2012, 12:28 PM

## 2012-09-17 ENCOUNTER — Other Ambulatory Visit (HOSPITAL_COMMUNITY): Payer: Self-pay | Admitting: Physician Assistant

## 2012-09-17 DIAGNOSIS — Z3689 Encounter for other specified antenatal screening: Secondary | ICD-10-CM

## 2012-09-17 LAB — OB RESULTS CONSOLE ANTIBODY SCREEN: Antibody Screen: NEGATIVE

## 2012-09-17 LAB — OB RESULTS CONSOLE HIV ANTIBODY (ROUTINE TESTING): HIV: NONREACTIVE

## 2012-09-17 LAB — OB RESULTS CONSOLE ABO/RH: RH TYPE: POSITIVE

## 2012-09-17 LAB — OB RESULTS CONSOLE RUBELLA ANTIBODY, IGM: Rubella: IMMUNE

## 2012-09-17 LAB — OB RESULTS CONSOLE GC/CHLAMYDIA
Chlamydia: NEGATIVE
Gonorrhea: NEGATIVE

## 2012-09-17 LAB — OB RESULTS CONSOLE HEPATITIS B SURFACE ANTIGEN: Hepatitis B Surface Ag: NEGATIVE

## 2012-09-17 LAB — OB RESULTS CONSOLE RPR: RPR: NONREACTIVE

## 2012-09-24 ENCOUNTER — Ambulatory Visit (HOSPITAL_COMMUNITY)
Admission: RE | Admit: 2012-09-24 | Discharge: 2012-09-24 | Disposition: A | Discharge status: Home / Self Care | Payer: Medicaid Other | Source: Ambulatory Visit | Attending: Physician Assistant | Admitting: Physician Assistant

## 2012-09-24 ENCOUNTER — Institutional Professional Consult (permissible substitution): Payer: Medicaid Other | Admitting: Cardiology

## 2012-09-24 DIAGNOSIS — O0930 Supervision of pregnancy with insufficient antenatal care, unspecified trimester: Secondary | ICD-10-CM | POA: Insufficient documentation

## 2012-09-24 DIAGNOSIS — Z3689 Encounter for other specified antenatal screening: Secondary | ICD-10-CM | POA: Insufficient documentation

## 2012-10-08 ENCOUNTER — Other Ambulatory Visit (HOSPITAL_COMMUNITY): Payer: Self-pay | Admitting: Physician Assistant

## 2012-10-08 DIAGNOSIS — Z0489 Encounter for examination and observation for other specified reasons: Secondary | ICD-10-CM

## 2012-10-20 ENCOUNTER — Ambulatory Visit (HOSPITAL_COMMUNITY): Payer: Medicaid Other

## 2012-10-20 ENCOUNTER — Ambulatory Visit (HOSPITAL_COMMUNITY)
Admission: RE | Admit: 2012-10-20 | Discharge: 2012-10-20 | Disposition: A | Discharge status: Home / Self Care | Payer: Medicaid Other | Source: Ambulatory Visit | Attending: Physician Assistant | Admitting: Physician Assistant

## 2012-10-20 ENCOUNTER — Other Ambulatory Visit (HOSPITAL_COMMUNITY): Payer: Self-pay | Admitting: Physician Assistant

## 2012-10-20 DIAGNOSIS — Z0489 Encounter for examination and observation for other specified reasons: Secondary | ICD-10-CM

## 2012-10-20 DIAGNOSIS — Z3689 Encounter for other specified antenatal screening: Secondary | ICD-10-CM | POA: Insufficient documentation

## 2012-10-20 NOTE — Progress Notes (Signed)
Anita [name]  was seen today for an ultrasound appointment.  See full report in [facility].  Impression: Single IUP at 21 5/7 weeks Ultrasound measurements consistent with dates Normal fetal anatmoic survey; however, somewhat limited views of the fetal heart were obtained Normal amniotic fluid volume  Recommendations: Recommend follow-up ultrasound examination in 4 weeks to reevaluate the fetal heart  Alpha Gula, MD

## 2012-10-27 ENCOUNTER — Ambulatory Visit: Payer: Medicaid Other | Admitting: Neurology

## 2012-10-27 ENCOUNTER — Encounter: Payer: Self-pay | Admitting: Neurology

## 2012-11-03 ENCOUNTER — Inpatient Hospital Stay (HOSPITAL_COMMUNITY)
Admission: AD | Admit: 2012-11-03 | Discharge: 2012-11-03 | Disposition: A | Discharge status: Home / Self Care | Payer: Medicaid Other | Source: Ambulatory Visit | Attending: Obstetrics and Gynecology | Admitting: Obstetrics and Gynecology

## 2012-11-03 ENCOUNTER — Encounter (HOSPITAL_COMMUNITY): Payer: Self-pay

## 2012-11-03 DIAGNOSIS — O2390 Unspecified genitourinary tract infection in pregnancy, unspecified trimester: Secondary | ICD-10-CM | POA: Insufficient documentation

## 2012-11-03 DIAGNOSIS — O26812 Pregnancy related exhaustion and fatigue, second trimester: Secondary | ICD-10-CM

## 2012-11-03 DIAGNOSIS — K5900 Constipation, unspecified: Secondary | ICD-10-CM | POA: Insufficient documentation

## 2012-11-03 DIAGNOSIS — B373 Candidiasis of vulva and vagina: Secondary | ICD-10-CM

## 2012-11-03 DIAGNOSIS — B3731 Acute candidiasis of vulva and vagina: Secondary | ICD-10-CM | POA: Insufficient documentation

## 2012-11-03 DIAGNOSIS — N949 Unspecified condition associated with female genital organs and menstrual cycle: Secondary | ICD-10-CM | POA: Insufficient documentation

## 2012-11-03 DIAGNOSIS — IMO0002 Reserved for concepts with insufficient information to code with codable children: Secondary | ICD-10-CM | POA: Insufficient documentation

## 2012-11-03 LAB — URINALYSIS, ROUTINE W REFLEX MICROSCOPIC
Nitrite: NEGATIVE
Protein, ur: NEGATIVE mg/dL
Urobilinogen, UA: 0.2 mg/dL (ref 0.0–1.0)

## 2012-11-03 LAB — URINE MICROSCOPIC-ADD ON

## 2012-11-03 LAB — WET PREP, GENITAL: Clue Cells Wet Prep HPF POC: NONE SEEN

## 2012-11-03 MED ORDER — DOCUSATE SODIUM 100 MG PO CAPS: 100.0000 mg | ORAL_CAPSULE | Freq: Two times a day (BID) | ORAL | Status: DC

## 2012-11-03 MED ORDER — FLUCONAZOLE 150 MG PO TABS
150.0000 mg | ORAL_TABLET | Freq: Once | ORAL | Status: AC
  Administered 2012-11-03: 150 mg via ORAL
  Filled 2012-11-03: qty 1

## 2012-11-03 MED ORDER — FLUCONAZOLE 150 MG PO TABS: 150.0000 mg | ORAL_TABLET | Freq: Once | ORAL | Status: DC

## 2012-11-03 MED ORDER — BISACODYL 10 MG RE SUPP: 10.0000 mg | Freq: Every day | RECTAL | Status: DC | PRN

## 2012-11-03 NOTE — MAU Note (Signed)
Pt states she was treated for yeast infection that got better. Started antibiotics for infected tooth and she thinks the yeast infection has come back. States being constipated x4 days. Denies vaginal bleeding or LOF. States good FM

## 2012-11-03 NOTE — MAU Provider Note (Signed)
Chief Complaint:  Vaginal Discharge, Fatigue and Constipation  First Provider Initiated Contact with Patient 11/03/12 [phone]     HPI: Anita [name] is a 27 y.o. G1P0 at [redacted]w[redacted]d who presents to maternity admissions reporting vaginal discharge, vaginal itching, constipation and fatigue. [facility].  Patient states she was diagnosed with vaginal yeast infection earlier in the pregnancy and experienced partial relief of symptoms with Terazol 7, but experienced vulvar irritation from the moisture associated with it. Recent course of antibiotics for a dental infection. Reports constipation throughout pregnancy, but no BM x4 days despite taking 3 doses of milk of magnesia. Reports worsening fatigue throughout the past few days or weeks. Hemoglobin 9.97/2014. Patient taking prescription prenatal vitamin regularly. Has had decreased appetite due to constipation in past week.  Denies fever, chills, nausea, urinary complaints, contractions, leakage of fluid or vaginal bleeding. Good fetal movement.   Past Medical History: Past Medical History  Diagnosis Date  . GERD (gastroesophageal reflux disease)   . MS (multiple sclerosis)     Past obstetric history: OB History  Gravida Para Term Preterm AB SAB TAB Ectopic Multiple Living  1         0    # Outcome Date GA Lbr Len/2nd Weight Sex Delivery Anes PTL Lv  1 CUR               Past Surgical History: Past Surgical History  Procedure Laterality Date  . Tonsillectomy    . Adenoidectomy       Family History: History reviewed. No pertinent family history.  Social History: History  Substance Use Topics  . Smoking status: Former Smoker    Quit date: 06/17/2012  . Smokeless tobacco: Not on file  . Alcohol Use: No    Allergies:  Allergies  Allergen Reactions  . Amoxicillin Other (See Comments)    Childhood reaction, doesn't remember    Meds:  Prescriptions prior to admission  Medication Sig Dispense  Refill  . diphenhydrAMINE (BENADRYL) 25 MG tablet Take 25 mg by mouth every 6 (six) hours as needed for sleep.      . Prenatal Vit-Fe Fumarate-FA (PRENATAL MULTIVITAMIN) TABS Take 1 tablet by mouth daily at 12 noon.        ROS: Pertinent findings in history of present illness.  Physical Exam  Blood pressure 104/66, pulse 76, temperature 98 F (36.7 C), temperature source Oral, resp. rate 18, height 5' 5.5" (1.664 m), weight 116.574 kg (257 lb), last menstrual period 05/21/2012, SpO2 100.00%. GENERAL: Well-developed, well-nourished female in no acute distress.  HEENT: normocephalic HEART: normal rate RESP: normal effort ABDOMEN: Mildly distended, non-tender, gravid appropriate for gestational age EXTREMITIES: Nontender, no edema NEURO: alert and oriented PELVIC EXAM: Normal external female genitalia. Speculum exam deferred. requested blind collection of wet prep. Small amount of creamy, white discharge visible.   FHT:  Baseline 130, minimal variability, accelerations absent, no decelerations Contractions: None   Labs: Results for orders placed during the hospital encounter of 11/03/12 (from the past 24 hour(s))  URINALYSIS, ROUTINE W REFLEX MICROSCOPIC     Status: Abnormal   Collection Time    11/03/12  6:40 AM      Result Value Range   Color, Urine YELLOW  YELLOW   APPearance CLOUDY (*) CLEAR   Specific Gravity, Urine 1.025  1.005 - 1.030   pH 6.0  5.0 - 8.0   Glucose, UA NEGATIVE  NEGATIVE mg/dL   Hgb urine dipstick NEGATIVE  NEGATIVE   Bilirubin Urine NEGATIVE  NEGATIVE   Ketones, ur NEGATIVE  NEGATIVE mg/dL   Protein, ur NEGATIVE  NEGATIVE mg/dL   Urobilinogen, UA 0.2  0.0 - 1.0 mg/dL   Nitrite NEGATIVE  NEGATIVE   Leukocytes, UA LARGE (*) NEGATIVE  URINE MICROSCOPIC-ADD ON     Status: Abnormal   Collection Time    11/03/12  6:40 AM      Result Value Range   Squamous Epithelial / LPF MANY (*) RARE   WBC, UA 7-10  <3 WBC/hpf   Bacteria, UA FEW (*) RARE  WET PREP,  GENITAL     Status: Abnormal   Collection Time    11/03/12  7:16 AM      Result Value Range   Yeast Wet Prep HPF POC NONE SEEN  NONE SEEN   Trich, Wet Prep NONE SEEN  NONE SEEN   Clue Cells Wet Prep HPF POC NONE SEEN  NONE SEEN   WBC, Wet Prep HPF POC FEW (*) NONE SEEN    Imaging:  N/A  MAU Course:   Assessment: 1. Constipation in pregnancy in second trimester   2. Fatigue during pregnancy in second trimester   3. Vulvovaginal candidiasis     Plan: Discharge home in stable condition. Increase fluids and fiber. Use Dulcolax suppository daily for 1-2 days. If no relief, use fleets enema. If no relief in 24 hours return to maternity admissions for soapsuds enema. May need to stay on Colace 1-2 times a day for maintenance. Recommend discontinuing prenatal vitamin x1 week. Preterm labor precautions and fetal kick counts     Follow-up Information   Follow up with [facility] On 11/06/2012. (As scheduled or as needed if symptoms worsen)    Contact information:   [address] [place] [address] [phone]      Follow up with [facility]. (If no bowel movement by 11/06/2012 or as needed in emergencies)    Contact information:   [address] [id number] [place] [address] [phone]       Medication List         bisacodyl 10 MG suppository  Commonly known as:  DULCOLAX  Place 1 suppository (10 mg total) rectally daily as needed for constipation.     diphenhydrAMINE 25 MG tablet  Commonly known as:  BENADRYL  Take 25 mg by mouth every 6 (six) hours as needed for sleep.     docusate sodium 100 MG capsule  Commonly known as:  COLACE  Take 1 capsule (100 mg total) by mouth 2 (two) times daily.     fluconazole 150 MG tablet  Commonly known as:  DIFLUCAN  Take 1 tablet (150 mg total) by mouth once. If no relief of symptoms within 3 days after first dose take 1 tablet.      prenatal multivitamin Tabs tablet  Take 1 tablet by mouth daily at 12 noon.       [place], CNM 11/03/2012 8:03 AM

## 2012-11-04 ENCOUNTER — Other Ambulatory Visit: Payer: Self-pay | Admitting: Obstetrics and Gynecology

## 2012-11-04 LAB — URINE CULTURE: Colony Count: >=100000

## 2012-11-04 MED ORDER — NITROFURANTOIN MONOHYD MACRO 100 MG PO CAPS: 100.0000 mg | ORAL_CAPSULE | Freq: Two times a day (BID) | ORAL | Status: DC

## 2012-11-04 NOTE — MAU Provider Note (Signed)
Attestation of Attending Supervision of Advanced Practitioner (CNM/NP): Evaluation and management procedures were performed by the Advanced Practitioner under my supervision and collaboration.  I have reviewed the Advanced Practitioner's note and chart, and I agree with the management and plan.  Galit Urich 11/04/2012 2:21 PM

## 2012-11-06 ENCOUNTER — Encounter: Payer: Self-pay | Admitting: General Practice

## 2012-11-06 ENCOUNTER — Telehealth: Payer: Self-pay | Admitting: General Practice

## 2012-11-06 NOTE — Telephone Encounter (Signed)
Message copied by Kathee Delton on Fri Nov 06, 2012  9:15 AM ------      Message from: CONSTANT, PEGGY      Created: Wed Nov 04, 2012  2:26 PM       Please inform patient of UTI. Rx e-prescribed.            Thanks      Clinical cytogeneticist ------

## 2012-11-06 NOTE — Telephone Encounter (Signed)
Called patient at home number and message states this is an incorrect phone number. Called patient at work and someone stated she was on break and not available at the moment. Will send patient letter.

## 2012-11-09 ENCOUNTER — Encounter: Payer: Self-pay | Admitting: *Deleted

## 2012-11-14 ENCOUNTER — Encounter: Payer: Self-pay | Admitting: *Deleted

## 2012-11-18 DIAGNOSIS — K219 Gastro-esophageal reflux disease without esophagitis: Secondary | ICD-10-CM | POA: Insufficient documentation

## 2012-11-18 DIAGNOSIS — G35 Multiple sclerosis: Secondary | ICD-10-CM | POA: Insufficient documentation

## 2012-11-19 ENCOUNTER — Encounter: Payer: Self-pay | Admitting: Cardiology

## 2012-11-19 ENCOUNTER — Ambulatory Visit (INDEPENDENT_AMBULATORY_CARE_PROVIDER_SITE_OTHER): Payer: Medicaid Other | Admitting: Cardiology

## 2012-11-19 VITALS — BP 93/59 | HR 80 | Ht 65.5 in | Wt 256.0 lb

## 2012-11-19 DIAGNOSIS — R002 Palpitations: Secondary | ICD-10-CM | POA: Insufficient documentation

## 2012-11-19 DIAGNOSIS — R0600 Dyspnea, unspecified: Secondary | ICD-10-CM | POA: Insufficient documentation

## 2012-11-19 DIAGNOSIS — R0609 Other forms of dyspnea: Secondary | ICD-10-CM

## 2012-11-19 NOTE — Assessment & Plan Note (Signed)
Patient with dyspnea attributed to upper respiratory infection and also mild edema. Plan echocardiogram to assess LV function.

## 2012-11-19 NOTE — Patient Instructions (Addendum)
Your physician recommends that you schedule a follow-up appointment in: AS NEEDED PENDING TEST RESULTS  Your physician has requested that you have an echocardiogram. Echocardiography is a painless test that uses sound waves to create images of your heart. It provides your doctor with information about the size and shape of your heart and how well your heart's chambers and valves are working. This procedure takes approximately one hour. There are no restrictions for this procedure.    

## 2012-11-19 NOTE — Assessment & Plan Note (Signed)
Symptoms have resolved. We will follow expectantly. If she has more symptoms in the future we'll consider CardioNet.

## 2012-11-19 NOTE — Progress Notes (Signed)
HPI: 27 year old female for evaluation of palpitations. Patient is [redacted] weeks pregnant. Patient states that during her first trimester she had occasional palpitations described as a brief flutter. They were not sustained. No associated syncope or chest pain. These have resolved completely and she has not had any in several months. She has mild dyspnea attributed to upper respiratory infection. No orthopnea or PND. Mild pedal edema. No chest pain or syncope.  Current Outpatient Prescriptions  Medication Sig Dispense Refill  . acetaminophen-codeine (TYLENOL #3) 300-30 MG per tablet Take 1 tablet by mouth as directed.      . bisacodyl (DULCOLAX) 10 MG suppository Place 1 suppository (10 mg total) rectally daily as needed for constipation.  6 suppository  0  . clindamycin (CLEOCIN) 150 MG capsule Take 300 mg by mouth 3 (three) times daily.      . diphenhydrAMINE (BENADRYL) 25 MG tablet Take 25 mg by mouth every 6 (six) hours as needed for sleep.      Marland Kitchen docusate sodium (COLACE) 100 MG capsule Take 1 capsule (100 mg total) by mouth 2 (two) times daily.  30 capsule  10  . fluconazole (DIFLUCAN) 150 MG tablet Take 1 tablet (150 mg total) by mouth once. If no relief of symptoms within 3 days after first dose take 1 tablet.  1 tablet  1  . nitrofurantoin, macrocrystal-monohydrate, (MACROBID) 100 MG capsule Take 1 capsule (100 mg total) by mouth 2 (two) times daily.  14 capsule  0  . Prenatal Vit-Fe Fumarate-FA (PRENATAL MULTIVITAMIN) TABS Take 1 tablet by mouth daily at 12 noon.       No current facility-administered medications for this visit.    Allergies  Allergen Reactions  . Amoxicillin Other (See Comments)    Childhood reaction, doesn't remember    Past Medical History  Diagnosis Date  . GERD (gastroesophageal reflux disease)   . MS (multiple sclerosis)     Past Surgical History  Procedure Laterality Date  . Tonsillectomy    . Adenoidectomy      History   Social History  .  Marital Status: Single    Spouse Name: N/A    Number of Children: N/A  . Years of Education: N/A   Occupational History  . Not on file.   Social History Main Topics  . Smoking status: Former Smoker    Quit date: 06/17/2012  . Smokeless tobacco: Not on file  . Alcohol Use: No  . Drug Use: No  . Sexual Activity: Yes    Birth Control/ Protection: None   Other Topics Concern  . Not on file   Social History Narrative  . No narrative on file    Family History  Problem Relation Age of Onset  . Heart disease      No family history    ROS: no fevers or chills, productive cough, hemoptysis, dysphasia, odynophagia, melena, hematochezia, dysuria, hematuria, rash, seizure activity, orthopnea, PND,  claudication. Remaining systems are negative.  Physical Exam:   Blood pressure 93/59, pulse 80, height 5' 5.5" (1.664 m), weight 256 lb (116.121 kg), last menstrual period 05/21/2012.  General:  Well developed/well nourished in NAD Skin warm/dry Patient not depressed No peripheral clubbing Back-normal HEENT-normal/normal eyelids Neck supple/normal carotid upstroke bilaterally; no bruits; no JVD; no thyromegaly chest - CTA/ normal expansion CV - RRR/normal S1 and S2; no murmurs, rubs or gallops;  PMI nondisplaced Abdomen -NT/ND, no HSM,  + bowel sounds, no bruit, intrauterine pregnancy 2+ femoral pulses, no bruits  Ext-trace edema, no chords, 2+ DP Neuro-grossly nonfocal  ECG sinus rhythm with nonspecific ST changes.

## 2012-12-04 ENCOUNTER — Other Ambulatory Visit (HOSPITAL_COMMUNITY): Payer: Medicaid Other

## 2013-02-10 ENCOUNTER — Inpatient Hospital Stay (HOSPITAL_COMMUNITY)
Admission: AD | Admit: 2013-02-10 | Discharge: 2013-02-10 | Disposition: A | Discharge status: Home / Self Care | Payer: Medicaid Other | Source: Ambulatory Visit | Attending: Obstetrics & Gynecology | Admitting: Obstetrics & Gynecology

## 2013-02-10 ENCOUNTER — Encounter (HOSPITAL_COMMUNITY): Payer: Self-pay

## 2013-02-10 DIAGNOSIS — O99891 Other specified diseases and conditions complicating pregnancy: Secondary | ICD-10-CM | POA: Insufficient documentation

## 2013-02-10 DIAGNOSIS — N949 Unspecified condition associated with female genital organs and menstrual cycle: Secondary | ICD-10-CM | POA: Insufficient documentation

## 2013-02-10 DIAGNOSIS — R102 Pelvic and perineal pain: Secondary | ICD-10-CM

## 2013-02-10 DIAGNOSIS — B9689 Other specified bacterial agents as the cause of diseases classified elsewhere: Secondary | ICD-10-CM

## 2013-02-10 HISTORY — DX: Urinary tract infection, site not specified: N39.0

## 2013-02-10 HISTORY — DX: Trichomoniasis, unspecified: A59.9

## 2013-02-10 LAB — URINALYSIS, ROUTINE W REFLEX MICROSCOPIC
Bilirubin Urine: NEGATIVE
Glucose, UA: NEGATIVE mg/dL
Ketones, ur: NEGATIVE mg/dL
Nitrite: NEGATIVE
Protein, ur: 30 mg/dL — AB
pH: 6 (ref 5.0–8.0)

## 2013-02-10 LAB — URINE MICROSCOPIC-ADD ON

## 2013-02-10 LAB — WET PREP, GENITAL: Yeast Wet Prep HPF POC: NONE SEEN

## 2013-02-10 MED ORDER — METRONIDAZOLE 500 MG PO TABS: 500.0000 mg | ORAL_TABLET | Freq: Two times a day (BID) | ORAL | Status: AC

## 2013-02-10 NOTE — MAU Provider Note (Signed)
Chief Complaint:  Vaginal Pain   Anita [name] is a 27 y.o.  G1P0 with IUP at [redacted]w[redacted]d presenting for Vaginal Pain  Pt presents with vaginal pressure and pain and with concern for rupture.  States has had increasing pressure and pain over the last week particularly when on her feet.  Also, has been having white discharge for the last 2 weeks, but today it became watery and her underwear have been wet on two different occasions.  She has lost her job and transportation has been difficult so she has not been to a prenatal visit over the last 3 months.  She denies any complications earlier in pregnancy. Care was at the HD up until that point.  No ctx, VB, +FM.    No fever, nausea, vomiting, diarrhea, cough, SOB.   Menstrual History: OB History   Grav Para Term Preterm Abortions TAB SAB Ect Mult Living   1         0       Patient's last menstrual period was 05/21/2012.      Past Medical History  Diagnosis Date  . GERD (gastroesophageal reflux disease)   . MS (multiple sclerosis)   . Trichomonas   . UTI (lower urinary tract infection)     Past Surgical History  Procedure Laterality Date  . Tonsillectomy    . Adenoidectomy      Family History  Problem Relation Age of Onset  . Heart disease      No family history    History  Substance Use Topics  . Smoking status: Former Smoker    Quit date: 06/17/2012  . Smokeless tobacco: Not on file  . Alcohol Use: No      Allergies  Allergen Reactions  . Amoxicillin Other (See Comments)    Childhood reaction, doesn't remember    Prescriptions prior to admission  Medication Sig Dispense Refill  . acetaminophen-codeine (TYLENOL #3) 300-30 MG per tablet Take 0.5 tablets by mouth daily as needed for moderate pain.       . Prenatal Vit-Fe Fumarate-FA (PRENATAL MULTIVITAMIN) TABS Take 1 tablet by mouth daily at 12 noon.        Review of Systems - Negative except for what is mentioned in HPI.  Physical Exam  Blood pressure 119/64,  pulse 65, temperature 99.2 F (37.3 C), temperature source Oral, resp. rate 18, height 5' 4.5" (1.638 m), weight 120.657 kg (266 lb), last menstrual period 05/21/2012. GENERAL: Well-developed, well-nourished female in no acute distress.  LUNGS: normal effort.  HEART: Regular rate. ABDOMEN: Soft, nontender, nondistended, gravid.  EXTREMITIES: Nontender, no edema, 2+ distal pulses.  SSE: normal external genitalia, normal vagina, cervix. No tenderness. Some mild watery discharge. Neg pool and fern.  Cervical Exam: Dilatation 0.5cm   Effacement 50%   Station ballotable    FHT:  Baseline rate 125 bpm   Variability moderate  Accelerations present   Decelerations none Contractions: irregular q7-9min.    Labs: Results for orders placed during the hospital encounter of 02/10/13 (from the past 24 hour(s))  URINALYSIS, ROUTINE W REFLEX MICROSCOPIC   Collection Time    02/10/13  4:55 PM      Result Value Range   Color, Urine YELLOW  YELLOW   APPearance HAZY (*) CLEAR   Specific Gravity, Urine >1.030 (*) 1.005 - 1.030   pH 6.0  5.0 - 8.0   Glucose, UA NEGATIVE  NEGATIVE mg/dL   Hgb urine dipstick NEGATIVE  NEGATIVE   Bilirubin Urine NEGATIVE  NEGATIVE   Ketones, ur NEGATIVE  NEGATIVE mg/dL   Protein, ur 30 (*) NEGATIVE mg/dL   Urobilinogen, UA 0.2  0.0 - 1.0 mg/dL   Nitrite NEGATIVE  NEGATIVE   Leukocytes, UA MODERATE (*) NEGATIVE  URINE MICROSCOPIC-ADD ON   Collection Time    02/10/13  4:55 PM      Result Value Range   Squamous Epithelial / LPF MANY (*) RARE   WBC, UA 7-10  <3 WBC/hpf   RBC / HPF 3-6  <3 RBC/hpf   Bacteria, UA MANY (*) RARE   Urine-Other MUCOUS PRESENT      Imaging Studies:  No results found.  Assessment: Anita [name] is  27 y.o. G1P0 at [redacted]w[redacted]d presents with Vaginal Pain .  Plan: 1) vaginal pressure - likely due to term gestation - cervix as above and not in labor - reassurance given  2) ?SROM  - neg pool and fern - story not consistent  - wet prep and  gc/chl sent. - wet prep with clue cells- will treat with flagyl. rx given  3) FWB - cat I tracing  4) discussed need for [facility] Pt states she can come to [facility] more easily due to where she lives. Will flag clinic for appt.   [name] L 12/24/20147:00 PM

## 2013-02-10 NOTE — MAU Note (Signed)
Vaginal pain since Sat, has been nauseated today, no appetite.  dc'd became watery, clear fluid around 1300, had small amt of mucous, but the watery d/c has continued

## 2013-02-10 NOTE — Progress Notes (Signed)
Dr Reola Calkins notified of patient, her history, presenting complains. Order to obtain GBS urine GC/C and a cervical exam. She will come to see patient for speculum exam.

## 2013-02-10 NOTE — MAU Note (Signed)
No care in 3 months.  Having financial problems

## 2013-02-11 LAB — URINE CULTURE: Colony Count: 50000

## 2013-02-12 LAB — CULTURE, BETA STREP (GROUP B ONLY)

## 2013-02-12 LAB — OB RESULTS CONSOLE GBS: STREP GROUP B AG: POSITIVE

## 2013-02-12 NOTE — MAU Provider Note (Signed)
Attestation of Attending Supervision of Fellow: Evaluation and management procedures were performed by the Fellow under my supervision and collaboration.  I have reviewed the Fellow's note and chart, and I agree with the management and plan.    

## 2013-02-15 ENCOUNTER — Other Ambulatory Visit: Payer: Self-pay | Admitting: Advanced Practice Midwife

## 2013-02-15 MED ORDER — NYSTATIN-TRIAMCINOLONE 100000-0.1 UNIT/GM-% EX OINT: 1.0000 "application " | TOPICAL_OINTMENT | Freq: Two times a day (BID) | CUTANEOUS | Status: DC

## 2013-02-15 MED ORDER — FLUCONAZOLE 150 MG PO TABS: 150.0000 mg | ORAL_TABLET | Freq: Once | ORAL | Status: DC

## 2013-02-15 NOTE — Progress Notes (Signed)
Anita [name] called and reports that she has been taking flagyl as prescribed. However, she forgot to mention that antibiotics generally cause a yeast infection, and she usually takes diflucan. She is reporting yeast like symptoms now. Will send RX for diflucan, and advised patient that is symptoms are not better in a few days then she must been seen so that we can examine her. She is agreeable, and plans to resume prenatal care at the clinic.

## 2013-02-18 NOTE — L&D Delivery Note (Signed)
Attestation of Attending Supervision of Fellow: Evaluation and management procedures were performed by the Fellow under my supervision and collaboration.  I have reviewed the Fellow's note and chart, and I agree with the management and plan.    

## 2013-02-18 NOTE — L&D Delivery Note (Signed)
Delivery Note At 12:37 PM a viable female was delivered via Vaginal, Spontaneous Delivery (Presentation: Right Occiput Anterior).  APGAR: 8, 9; weight .   Placenta status: Intact, Spontaneous.  Cord: 3 vessels with the following complications: None.    Anesthesia: Epidural  Episiotomy: None Lacerations: 2nd degree;Perineal Suture Repair: 3.0 monocryl on CT Est. Blood Loss (mL): 350  Mom to postpartum.  Baby to Couplet care / Skin to Skin.  28 y.o. F presented to MAU with SROM at [facility]. Progressed with pitocin to a NSVD over a 2nd degree tear. Active management of 3rd stage with pit and traction delivered intact placenta with 3v cord. 2nd degree repaired in usual fashion with 3.0 monocryl on CT. EBL 350. Counts correct, Hemostatic at time of completion.  Jolyn LentODOM, Ilka Lovick RYAN 02/21/2013, 1:08 PM

## 2013-02-20 ENCOUNTER — Encounter (HOSPITAL_COMMUNITY): Payer: Self-pay | Admitting: *Deleted

## 2013-02-20 ENCOUNTER — Inpatient Hospital Stay (HOSPITAL_COMMUNITY)
Admission: AD | Admit: 2013-02-20 | Discharge: 2013-02-22 | DRG: 775 | Disposition: A | Discharge status: Home / Self Care | Payer: Medicaid Other | Source: Ambulatory Visit | Attending: Family Medicine | Admitting: Family Medicine

## 2013-02-20 DIAGNOSIS — R109 Unspecified abdominal pain: Secondary | ICD-10-CM

## 2013-02-20 DIAGNOSIS — IMO0001 Reserved for inherently not codable concepts without codable children: Secondary | ICD-10-CM

## 2013-02-20 DIAGNOSIS — Z87891 Personal history of nicotine dependence: Secondary | ICD-10-CM

## 2013-02-20 DIAGNOSIS — O26899 Other specified pregnancy related conditions, unspecified trimester: Secondary | ICD-10-CM | POA: Diagnosis present

## 2013-02-20 LAB — CBC
HCT: 31.1 % — ABNORMAL LOW (ref 36.0–46.0)
Hemoglobin: 10.2 g/dL — ABNORMAL LOW (ref 12.0–15.0)
MCH: 23 pg — ABNORMAL LOW (ref 26.0–34.0)
MCHC: 32.8 g/dL (ref 30.0–36.0)
MCV: 70.2 fL — ABNORMAL LOW (ref 78.0–100.0)
PLATELETS: 342 10*3/uL (ref 150–400)
RBC: 4.43 MIL/uL (ref 3.87–5.11)
RDW: 14.6 % (ref 11.5–15.5)
WBC: 8 10*3/uL (ref 4.0–10.5)

## 2013-02-20 LAB — AMNISURE RUPTURE OF MEMBRANE (ROM) NOT AT ARMC: AMNISURE: POSITIVE

## 2013-02-20 LAB — RAPID URINE DRUG SCREEN, HOSP PERFORMED
AMPHETAMINES: NOT DETECTED
BARBITURATES: NOT DETECTED
Benzodiazepines: NOT DETECTED
COCAINE: NOT DETECTED
OPIATES: NOT DETECTED
TETRAHYDROCANNABINOL: POSITIVE — AB

## 2013-02-20 MED ORDER — CITRIC ACID-SODIUM CITRATE 334-500 MG/5ML PO SOLN: 30.0000 mL | ORAL | Status: DC | PRN

## 2013-02-20 MED ORDER — LACTATED RINGERS IV SOLN
500.0000 mL | INTRAVENOUS | Status: DC | PRN
  Administered 2013-02-21: 500 mL via INTRAVENOUS

## 2013-02-20 MED ORDER — ONDANSETRON HCL 4 MG/2ML IJ SOLN: 4.0000 mg | Freq: Four times a day (QID) | INTRAMUSCULAR | Status: DC | PRN

## 2013-02-20 MED ORDER — OXYTOCIN 40 UNITS IN LACTATED RINGERS INFUSION - SIMPLE MED: 62.5000 mL/h | INTRAVENOUS | Status: DC

## 2013-02-20 MED ORDER — LIDOCAINE HCL (PF) 1 % IJ SOLN
30.0000 mL | INTRAMUSCULAR | Status: DC | PRN
  Filled 2013-02-20 (×2): qty 30

## 2013-02-20 MED ORDER — SODIUM CHLORIDE 0.9 % IV SOLN: 2.0000 g | Freq: Once | INTRAVENOUS | Status: DC

## 2013-02-20 MED ORDER — TERBUTALINE SULFATE 1 MG/ML IJ SOLN: 0.2500 mg | Freq: Once | INTRAMUSCULAR | Status: AC | PRN

## 2013-02-20 MED ORDER — NALBUPHINE SYRINGE 5 MG/0.5 ML
10.0000 mg | INJECTION | Freq: Once | INTRAMUSCULAR | Status: AC
Start: 2013-02-20 — End: 2013-02-20
  Administered 2013-02-20: 10 mg via INTRAVENOUS
  Filled 2013-02-20: qty 1

## 2013-02-20 MED ORDER — ACETAMINOPHEN 325 MG PO TABS: 650.0000 mg | ORAL_TABLET | ORAL | Status: DC | PRN

## 2013-02-20 MED ORDER — OXYCODONE-ACETAMINOPHEN 5-325 MG PO TABS: 1.0000 | ORAL_TABLET | ORAL | Status: DC | PRN

## 2013-02-20 MED ORDER — LACTATED RINGERS IV SOLN
INTRAVENOUS | Status: DC
  Administered 2013-02-20 – 2013-02-21 (×2): via INTRAVENOUS

## 2013-02-20 MED ORDER — IBUPROFEN 600 MG PO TABS: 600.0000 mg | ORAL_TABLET | Freq: Four times a day (QID) | ORAL | Status: DC | PRN

## 2013-02-20 MED ORDER — OXYTOCIN BOLUS FROM INFUSION
500.0000 mL | INTRAVENOUS | Status: DC
  Administered 2013-02-21: 500 mL via INTRAVENOUS

## 2013-02-20 MED ORDER — OXYTOCIN 40 UNITS IN LACTATED RINGERS INFUSION - SIMPLE MED
1.0000 m[IU]/min | INTRAVENOUS | Status: DC
  Administered 2013-02-20: 2 m[IU]/min via INTRAVENOUS
  Administered 2013-02-21: 4 m[IU]/min via INTRAVENOUS
  Filled 2013-02-20: qty 1000

## 2013-02-20 NOTE — MAU Note (Signed)
Provider here to admit pt. Orders received. Morrie Sheldon, RN [employer] charge nurse called. Room 171 given. Report given at this time.

## 2013-02-20 NOTE — H&P (Signed)
Attestation of Attending Supervision of Advanced Practitioner (PA/CNM/NP): Evaluation and management procedures were performed by the Advanced Practitioner under my supervision and collaboration.  I have reviewed the Advanced Practitioner's note and chart, and I agree with the management and plan.  Reva Bores, MD [facility] Attending 02/20/2013 10:13 PM

## 2013-02-20 NOTE — MAU Note (Signed)
Anita [name], CNM notified of amnisure positive result. Urine drug screen ordered. States she will be down to see pt. Shortly.

## 2013-02-20 NOTE — MAU Note (Signed)
Pt. Here due to questionable rom that happened this eve around 1900. Felt a gush. Also continues to feel damp. Denies any bleeding or leakage of fluid before this time. Also, baby is moving well. Pt. States she has been contracting since Thursday but they have been irregular. Tonight after gush has been contracting every 5-696mins.

## 2013-02-20 NOTE — MAU Note (Signed)
Pt. Transferred to birthing suites 171 via wheelchair at this time.

## 2013-02-20 NOTE — MAU Note (Addendum)
Have leaked fld twice tonight. Having some contractions. Fld is clear. Started care at [facility] but has not been there in 4 months due to financial issues.

## 2013-02-20 NOTE — H&P (Signed)
Anita [name] is a 28 y.o. female presenting for SROM at 1900 states leaking large amt cl fluid. Maternal Medical History:  Reason for admission: Rupture of membranes and contractions.   Contractions: Onset was 1-2 hours ago.   Frequency: irregular.   Perceived severity is mild.    Fetal activity: Perceived fetal activity is normal.   Last perceived fetal movement was within the past hour.      OB History   Grav Para Term Preterm Abortions TAB SAB Ect Mult Living   1         0     Past Medical History  Diagnosis Date  . GERD (gastroesophageal reflux disease)   . MS (multiple sclerosis)   . Trichomonas   . UTI (lower urinary tract infection)    Past Surgical History  Procedure Laterality Date  . Tonsillectomy    . Adenoidectomy     Family History: family history includes Heart disease in an other family member. Social History:  reports that she quit smoking about 8 months ago. She does not have any smokeless tobacco history on file. She reports that she uses illicit drugs (Marijuana). She reports that she does not drink alcohol.   Prenatal Transfer Tool  Maternal Diabetes: No Genetic Screening: Normal Maternal Ultrasounds/Referrals: Normal Fetal Ultrasounds or other Referrals:  None Maternal Substance Abuse:  Yes:  Type: Marijuana Significant Maternal Medications:  None Significant Maternal Lab Results:  None Other Comments:  None  Review of Systems  Constitutional: Negative.   HENT: Negative.   Eyes: Negative.   Respiratory: Negative.   Cardiovascular: Negative.   Gastrointestinal: Positive for abdominal pain.  Genitourinary: Negative.   Musculoskeletal: Negative.   Skin: Negative.   Endo/Heme/Allergies: Negative.   Psychiatric/Behavioral: Negative.     Dilation: Fingertip Exam by:: Zerita Boersarlene Shondrea Steinert, CNM Blood pressure 115/69, pulse 82, temperature 98.9 F (37.2 C), resp. rate 20, height 5\' 5"  (1.651 m), weight 265 lb 12.8 oz (120.566 kg), last menstrual  period 05/21/2012. Maternal Exam:  Uterine Assessment: Contraction strength is mild.  Contraction frequency is irregular.   Abdomen: Patient reports no abdominal tenderness. Fetal presentation: vertex  Introitus: Normal vulva.   Physical Exam  Vitals reviewed. Constitutional: She is oriented to person, place, and time. She appears well-developed and well-nourished.  HENT:  Head: Normocephalic.  Cardiovascular: Normal rate, regular rhythm, normal heart sounds and intact distal pulses.   Respiratory: Effort normal and breath sounds normal.  GI: Soft. Bowel sounds are normal.  Genitourinary: Vagina normal and uterus normal.  Musculoskeletal: Normal range of motion.  Neurological: She is alert and oriented to person, place, and time. She has normal reflexes.  Skin: Skin is warm and dry.  Psychiatric: She has a normal mood and affect. Her behavior is normal. Judgment and thought content normal.    Prenatal labs: ABO, Rh:   Antibody:   Rubella:   RPR:    HBsAg:    HIV:    GBS:     Assessment/Plan: SROM   Anita [name] Anita [name] 02/20/2013, 9:32 PM

## 2013-02-21 ENCOUNTER — Encounter (HOSPITAL_COMMUNITY): Payer: Self-pay | Admitting: *Deleted

## 2013-02-21 ENCOUNTER — Encounter (HOSPITAL_COMMUNITY): Payer: Medicaid Other | Admitting: Anesthesiology

## 2013-02-21 ENCOUNTER — Inpatient Hospital Stay (HOSPITAL_COMMUNITY): Payer: Medicaid Other | Admitting: Anesthesiology

## 2013-02-21 DIAGNOSIS — Z87891 Personal history of nicotine dependence: Secondary | ICD-10-CM

## 2013-02-21 LAB — TYPE AND SCREEN
ABO/RH(D): O POS
Antibody Screen: NEGATIVE

## 2013-02-21 LAB — ABO/RH: ABO/RH(D): O POS

## 2013-02-21 LAB — RPR: RPR: NONREACTIVE

## 2013-02-21 MED ORDER — SIMETHICONE 80 MG PO CHEW: 80.0000 mg | CHEWABLE_TABLET | ORAL | Status: DC | PRN

## 2013-02-21 MED ORDER — DIBUCAINE 1 % RE OINT
1.0000 "application " | TOPICAL_OINTMENT | RECTAL | Status: DC | PRN
  Administered 2013-02-22: 1 via RECTAL
  Filled 2013-02-21: qty 28

## 2013-02-21 MED ORDER — PHENYLEPHRINE 40 MCG/ML (10ML) SYRINGE FOR IV PUSH (FOR BLOOD PRESSURE SUPPORT)
80.0000 ug | PREFILLED_SYRINGE | INTRAVENOUS | Status: DC | PRN
  Filled 2013-02-21: qty 10
  Filled 2013-02-21: qty 2

## 2013-02-21 MED ORDER — DIPHENHYDRAMINE HCL 25 MG PO CAPS: 25.0000 mg | ORAL_CAPSULE | Freq: Four times a day (QID) | ORAL | Status: DC | PRN

## 2013-02-21 MED ORDER — SODIUM BICARBONATE 8.4 % IV SOLN
INTRAVENOUS | Status: DC | PRN
  Administered 2013-02-21: 5 mL via EPIDURAL

## 2013-02-21 MED ORDER — IBUPROFEN 600 MG PO TABS
600.0000 mg | ORAL_TABLET | Freq: Four times a day (QID) | ORAL | Status: DC
  Administered 2013-02-21 – 2013-02-22 (×4): 600 mg via ORAL
  Filled 2013-02-21 (×4): qty 1

## 2013-02-21 MED ORDER — ONDANSETRON HCL 4 MG PO TABS: 4.0000 mg | ORAL_TABLET | ORAL | Status: DC | PRN

## 2013-02-21 MED ORDER — CEFAZOLIN SODIUM 1-5 GM-% IV SOLN
1.0000 g | Freq: Three times a day (TID) | INTRAVENOUS | Status: DC
  Filled 2013-02-21 (×2): qty 50

## 2013-02-21 MED ORDER — BENZOCAINE-MENTHOL 20-0.5 % EX AERO
1.0000 "application " | INHALATION_SPRAY | CUTANEOUS | Status: DC | PRN
  Administered 2013-02-21: 1 via TOPICAL
  Filled 2013-02-21: qty 56

## 2013-02-21 MED ORDER — LANOLIN HYDROUS EX OINT: TOPICAL_OINTMENT | CUTANEOUS | Status: DC | PRN

## 2013-02-21 MED ORDER — TETANUS-DIPHTH-ACELL PERTUSSIS 5-2.5-18.5 LF-MCG/0.5 IM SUSP: 0.5000 mL | Freq: Once | INTRAMUSCULAR | Status: DC

## 2013-02-21 MED ORDER — PHENYLEPHRINE 40 MCG/ML (10ML) SYRINGE FOR IV PUSH (FOR BLOOD PRESSURE SUPPORT)
80.0000 ug | PREFILLED_SYRINGE | INTRAVENOUS | Status: DC | PRN
  Filled 2013-02-21: qty 2

## 2013-02-21 MED ORDER — SENNOSIDES-DOCUSATE SODIUM 8.6-50 MG PO TABS
2.0000 | ORAL_TABLET | ORAL | Status: DC
  Administered 2013-02-22: 2 via ORAL
  Filled 2013-02-21: qty 2

## 2013-02-21 MED ORDER — WITCH HAZEL-GLYCERIN EX PADS
1.0000 "application " | MEDICATED_PAD | CUTANEOUS | Status: DC | PRN
  Administered 2013-02-22: 1 via TOPICAL

## 2013-02-21 MED ORDER — DIPHENHYDRAMINE HCL 50 MG/ML IJ SOLN: 12.5000 mg | INTRAMUSCULAR | Status: DC | PRN

## 2013-02-21 MED ORDER — ONDANSETRON HCL 4 MG/2ML IJ SOLN: 4.0000 mg | INTRAMUSCULAR | Status: DC | PRN

## 2013-02-21 MED ORDER — ZOLPIDEM TARTRATE 5 MG PO TABS
5.0000 mg | ORAL_TABLET | Freq: Every evening | ORAL | Status: DC | PRN
Start: 2013-02-21 — End: 2013-02-22

## 2013-02-21 MED ORDER — FENTANYL 2.5 MCG/ML BUPIVACAINE 1/10 % EPIDURAL INFUSION (WH - ANES)
14.0000 mL/h | INTRAMUSCULAR | Status: DC | PRN
  Administered 2013-02-21 (×2): 14 mL/h via EPIDURAL
  Filled 2013-02-21 (×2): qty 125

## 2013-02-21 MED ORDER — EPHEDRINE 5 MG/ML INJ
10.0000 mg | INTRAVENOUS | Status: DC | PRN
  Filled 2013-02-21: qty 2

## 2013-02-21 MED ORDER — CEFAZOLIN SODIUM-DEXTROSE 2-3 GM-% IV SOLR
2.0000 g | Freq: Once | INTRAVENOUS | Status: AC
Start: 2013-02-21 — End: 2013-02-21
  Administered 2013-02-21: 2 g via INTRAVENOUS
  Filled 2013-02-21: qty 50

## 2013-02-21 MED ORDER — LACTATED RINGERS IV SOLN
500.0000 mL | Freq: Once | INTRAVENOUS | Status: AC
  Administered 2013-02-21: 500 mL via INTRAVENOUS

## 2013-02-21 MED ORDER — EPHEDRINE 5 MG/ML INJ
10.0000 mg | INTRAVENOUS | Status: DC | PRN
  Filled 2013-02-21: qty 4
  Filled 2013-02-21: qty 2

## 2013-02-21 MED ORDER — OXYCODONE-ACETAMINOPHEN 5-325 MG PO TABS
1.0000 | ORAL_TABLET | ORAL | Status: DC | PRN
  Administered 2013-02-21: 1 via ORAL
  Administered 2013-02-22: 2 via ORAL
  Filled 2013-02-21: qty 2
  Filled 2013-02-21: qty 1

## 2013-02-21 MED ORDER — PRENATAL MULTIVITAMIN CH
1.0000 | ORAL_TABLET | Freq: Every day | ORAL | Status: DC
  Administered 2013-02-21 – 2013-02-22 (×2): 1 via ORAL
  Filled 2013-02-21 (×2): qty 1

## 2013-02-21 NOTE — Progress Notes (Signed)
This note also relates to the following rows which could not be included: Dose (milli-units/min) Oxytocin - Cannot attach notes to extension rows Rate (mL/hr) Oxytocin - Cannot attach notes to extension rows Concentration Oxytocin - Cannot attach notes to extension rows   Pitocin line not connected to patient.  Line connected and Pitocin started at 392mu/min.

## 2013-02-21 NOTE — Anesthesia Preprocedure Evaluation (Signed)
Anesthesia Evaluation  Patient identified by MRN, date of birth, ID band Patient awake    Reviewed: Allergy & Precautions, H&P , Patient's Chart, lab work & pertinent test results  Airway Mallampati: II TM Distance: >3 FB Neck ROM: full    Dental  (+) Teeth Intact   Pulmonary former smoker,  breath sounds clear to auscultation        Cardiovascular Rhythm:regular Rate:Normal     Neuro/Psych  Neuromuscular disease (MS)    GI/Hepatic GERD-  ,  Endo/Other  Morbid obesity  Renal/GU      Musculoskeletal   Abdominal   Peds  Hematology   Anesthesia Other Findings       Reproductive/Obstetrics (+) Pregnancy                           Anesthesia Physical Anesthesia Plan  ASA: III  Anesthesia Plan: Epidural   Post-op Pain Management:    Induction:   Airway Management Planned:   Additional Equipment:   Intra-op Plan:   Post-operative Plan:   Informed Consent: I have reviewed the patients History and Physical, chart, labs and discussed the procedure including the risks, benefits and alternatives for the proposed anesthesia with the patient or authorized representative who has indicated his/her understanding and acceptance.   Dental Advisory Given  Plan Discussed with:   Anesthesia Plan Comments: (Labs checked- platelets confirmed with RN in room. Fetal heart tracing, per RN, reported to be stable enough for sitting procedure. Discussed epidural, and patient consents to the procedure:  included risk of possible headache,backache, failed block, allergic reaction, and nerve injury. This patient was asked if she had any questions or concerns before the procedure started.)        Anesthesia Quick Evaluation

## 2013-02-21 NOTE — Anesthesia Procedure Notes (Signed)
Epidural Patient location during procedure: OB  Preanesthetic Checklist Completed: patient identified, site marked, surgical consent, pre-op evaluation, timeout performed, IV checked, risks and benefits discussed and monitors and equipment checked  Epidural Patient position: sitting Prep: site prepped and draped and DuraPrep Patient monitoring: continuous pulse ox and blood pressure Approach: midline Injection technique: LOR air  Needle:  Needle type: Tuohy  Needle gauge: 17 G Needle length: 9 cm and 9 Needle insertion depth: 8 cm Catheter type: closed end flexible Catheter size: 19 Gauge Catheter at skin depth: 15 cm Test dose: negative  Assessment Events: blood not aspirated, injection not painful, no injection resistance, negative IV test and no paresthesia  Additional Notes Dosing of Epidural:  1st dose, through catheter ............................................. epi 1:200K + Xylocaine 40 mg  2nd dose, through catheter, after waiting 3 minutes.....epi 1:200K + Xylocaine 60 mg    ( 2% Xylo charted as a single dose in Epic Meds for ease of charting; actual dosing was fractionated as above, for saftey's sake)  As each dose occurred, patient was free of IV sx; and patient exhibited no evidence of SA injection.  Patient is more comfortable after epidural dosed. Please see RN's note for documentation of vital signs,and FHR which are stable.  Patient reminded not to try to ambulate with numb legs, and that an RN must be present when she attempts to get up.       

## 2013-02-21 NOTE — Progress Notes (Addendum)
Anita [name] is a 28 y.o. G1P0 at [redacted]w[redacted]d by ultrasound admitted for rupture of membranes  Subjective: + having difficulty with pain control with epidural. Lopsided pain control.  Objective: BP 123/65  Pulse 72  Temp(Src) 97.9 F (36.6 C) (Oral)  Resp 16  Ht 5\' 5"  (1.651 m)  Wt 120.566 kg (265 lb 12.8 oz)  BMI 44.23 kg/m2  SpO2 99%  LMP 05/21/2012      FHT:  FHR: 120s bpm, variability: moderate,  accelerations:  Present,  decelerations:  Present occassional variable decels. UC:   regular, every 2 minutes SVE:   Dilation: Lip/rim Effacement (%): 100 Station: +2 Exam by:: Dr. Marlana Latus  Labs: Lab Results  Component Value Date   WBC 8.0 02/20/2013   HGB 10.2* 02/20/2013   HCT 31.1* 02/20/2013   MCV 70.2* 02/20/2013   PLT 342 02/20/2013    Assessment / Plan: Augmentation of labor, progressing well  Labor: Progressing normally recheck in 1 hr, will allow first round of ancef to be completed unless fetal intolerance Preeclampsia:  no signs or symptoms of toxicity Fetal Wellbeing:  Category II Pain Control:  Epidural I/D:  GBS+, started on Ancef Anticipated MOD:  NSVD  Anita [name] 02/21/2013, 10:04 AM

## 2013-02-21 NOTE — Progress Notes (Signed)
Anita [name] is a 28 y.o. G1P0 at 1951w3d by ultrasound admitted for rupture of membranes  Subjective:   Objective: BP 112/58  Pulse 75  Temp(Src) 98.4 F (36.9 C) (Oral)  Resp 18  Ht 5\' 5"  (1.651 m)  Wt 265 lb 12.8 oz (120.566 kg)  BMI 44.23 kg/m2  SpO2 99%  LMP 05/21/2012      FHT:  FHR: 120's bpm, variability: moderate,  accelerations:  Present,  decelerations:  Absent UC:   regular, every 2 minutes SVE:   Dilation: 4 Effacement (%): 100 Station: -2 Exam by:: Bertram MillardJ. Lopez, RN  Labs: Lab Results  Component Value Date   WBC 8.0 02/20/2013   HGB 10.2* 02/20/2013   HCT 31.1* 02/20/2013   MCV 70.2* 02/20/2013   PLT 342 02/20/2013    Assessment / Plan: Augmentation of labor, progressing well  Labor: Progressing normally Preeclampsia:  no signs or symptoms of toxicity Fetal Wellbeing:  Category I Pain Control:  Epidural I/D:  n/a Anticipated MOD:  NSVD  Anita [name] 02/21/2013, 6:40 AM

## 2013-02-21 NOTE — Anesthesia Postprocedure Evaluation (Signed)
  Anesthesia Post-op Note  Patient: Anita [name]  Procedure(s) Performed: * No procedures listed *  Patient Location: Mother/Baby  Anesthesia Type:Epidural  Level of Consciousness: awake  Airway and Oxygen Therapy: Patient Spontanous Breathing  Post-op Pain: mild  Post-op Assessment: Patient's Cardiovascular Status Stable and Respiratory Function Stable  Post-op Vital Signs: stable  Complications: No apparent anesthesia complications

## 2013-02-21 NOTE — Progress Notes (Signed)
Complete now pushing Fetus tolerating  Making movement, expect NSVD  Tawana Scale, MD [facility] Fellow

## 2013-02-22 ENCOUNTER — Ambulatory Visit: Payer: Self-pay

## 2013-02-22 MED ORDER — IBUPROFEN 600 MG PO TABS: 600.0000 mg | ORAL_TABLET | Freq: Four times a day (QID) | ORAL | Status: DC

## 2013-02-22 NOTE — Discharge Instructions (Signed)

## 2013-02-22 NOTE — Discharge Summary (Signed)
Obstetric Discharge Summary Reason for Admission: onset of labor Prenatal Procedures: NST Intrapartum Procedures: spontaneous vaginal delivery Postpartum Procedures: none Complications-Operative and Postpartum: 2nd degree perineal laceration Hemoglobin  Date Value Range Status  02/20/2013 10.2* 12.0 - 15.0 g/dL Final     HCT  Date Value Range Status  02/20/2013 31.1* 36.0 - 46.0 % Final    Physical Exam:  General: alert, cooperative and no distress Lochia: appropriate Uterine Fundus: firm Incision: na DVT Evaluation: No evidence of DVT seen on physical exam. No cords or calf tenderness. No significant calf/ankle edema.  Discharge Diagnoses: Term Pregnancy-delivered  Discharge Information: Date: 02/22/2013 Activity: pelvic rest Diet: routine Medications: PNV and Ibuprofen Condition: stable Instructions: refer to practice specific booklet Discharge to: home Follow-up Information   [facility] [place]. Schedule an appointment as soon as possible for a visit in 4 weeks.   Contact information:   [address] [place] [address] [phone]      Newborn Data: Live born female  Birth Weight: 6 lb 4.2 oz (2841 g) APGAR: 8, 9  Home with mother.  Pt presented to the MAU with SROM and progressed with pitocin to an NSVD complicated by a 2nd degree tear. Repair was uncomplicated. Postpartum care was routine. She is breast feeding and desiress nexplanon for contraception.   Sharrie Self L 02/22/2013, 7:51 AM

## 2013-02-22 NOTE — Lactation Note (Signed)
This note was copied from the chart of Boy Meraiah Petterson. Lactation Consultation Note  Patient Name: Boy Aneira Lifsey KGOVP'C Date: 02/22/2013 Reason for consult: Initial assessment of this primipara and her newborn at 17 hours of age. Mom planning to return to school in May and is on [facility], planning to feed baby some ebm or formula by bottle.  LC reviewed LEAD guidelines and reasons to exclusively breastfeed, especially during first 2 weeks to establish successful breastfeeding and milk supply.  LC encouraged her to seek ongoing support from [facility] and [facility] after discharge and cue feed, with frequent STS.  Baby is currently breastfeeding exclusively for 10-20 minutes per feeding with output above minimum for this hour of life.  Mom denies any problems and is aware of importance of deep latch.  She is able to express colostrum and states she attended prenatal [facility] class, where she was shown this and other BF techniques.LC encouraged review of Baby and Me pp 9, 14 and 20-25 for STS and BF information. LC provided [employer] Resource brochure and reviewed [place] services and list of community and web site resources.       Maternal Data Formula Feeding for Exclusion: Yes Reason for exclusion: Mother's choice to formula and breast feed on admission Infant to breast within first hour of birth: Yes (initial LATCH score=9) Has patient been taught Hand Expression?: Yes (mom states she was shown by [facility] during prenatal class) Does the patient have breastfeeding experience prior to this delivery?: No  Feeding Length of feed: 22 min  LATCH Score/Interventions Latch: Grasps breast easily, tongue down, lips flanged, rhythmical sucking.  Audible Swallowing: Spontaneous and intermittent  Type of Nipple: Everted at rest and after stimulation  Comfort (Breast/Nipple): Soft / non-tender     Hold (Positioning): Assistance needed to correctly position infant at breast and maintain latch.  LATCH Score: 9  (previous assessment by  RN)  Lactation Tools Discussed/Used Pump Review: Milk Storage (mom planning to contact [facility] for pump (return to  school in May)) STS, cue feedings ad lib, hand expression Signs of proper latch  Consult Status Consult Status: Follow-up Date: 02/23/13 Follow-up type: In-patient    Warrick Parisian [facility] 02/22/2013, 6:37 PM

## 2013-02-22 NOTE — Progress Notes (Signed)
UR chart review completed.  

## 2013-02-22 NOTE — Clinical Social Work Maternal (Signed)
Clinical Social Work Department PSYCHOSOCIAL ASSESSMENT - MATERNAL/CHILD 02/22/2013  Patient:  [name],[name]  Account Number:  [id number]  Admit Date:  02/20/2013  Ardine Eng Name:   Anita [name]    Clinical Social Worker:  Gerri Spore, LCSW   Date/Time:  02/22/2013 11:55 AM  Date Referred:  02/22/2013   Referral source  CN     Referred reason  Substance Abuse   Other referral source:    I:  FAMILY / [place] legal guardian:  PARENT  Guardian - Name Guardian - Age Guardian - Address  Anita [name] 28 [address] [place], [place] [address]  Anita [name] 33 (same as above)   Other household support members/support persons Other support:    II  PSYCHOSOCIAL DATA Information Source:  Patient Interview  Insurance risk surveyor Resources Employment:   Nature conservation officer resources:  [employer] [place]:  [employer] / Grade:   Maternity Care Coordinator / Child Services Coordination / Early Interventions:  Cultural issues impacting care:    III  STRENGTHS Strengths  Adequate Resources  Home prepared for Child (including basic supplies)  Supportive family/friends   Strength comment:    IV  RISK FACTORS AND CURRENT PROBLEMS Current Problem:  YES   Risk Factor & Current Problem Patient Issue Family Issue Risk Factor / Current Problem Comment  Other - See comment Y N [employer]  Substance Abuse Y N Hx MJ use   N N     V  SOCIAL WORK ASSESSMENT  CSW met with pt to assess reason for limited [facility] & MJ use.  Pt is currently living with the FOB, who she describes as her primary support person.  This is the couples first baby & they both seem happy about the birth.  Pt became tearful, as she talked about her mother being a major source of stress currently & during pregnancy.  According to pt, she & her mother had a verbal altercation this morning right before CSW entered the room.  Pt asked her mother to leave her hospital room  after she became visibly upset.  Pt told CSW that her mother is very controlling & demeaning.  When her mother learned about the pregnancy, their relationship became even more strained.  She didn't approve of the pregnancy or pt's boyfriend.  Her mother lives in [place], [place] & took pt's car away from her during pregnancy. Therefore, she was not able to attend all of her appointments. She talked about her desire to have a healthy "mother-daughter" relationship but says her mother is very negative.  Pt seems motivated to repair their relationship & doesn't think her mother understands her point of view.  Due to pt's increased stress level, she admits to self medicating with MJ.  Prior to pregnancy confirmation she admits to smoking MJ "once a week."  Pt continued to smoke majority of pregnancy (except for 2 months).  She last smoked on her birthday [birth year].  The MJ smoke helps relieve her stress level.  She denies other illegal substance use & verbalized understanding of hospital drug testing policy.  UDS is negative, meconium results are pending.  Pt has all the necessary supplies for the infant.  She expressed the need for a bassinet or crib.  CW will inquire about resources through [employer].    Support system is limited to FOB.  FOB recently told his parents about the baby & according to pt, they are  supportive.   CSW offered to provide pt with counseling options & she was receptive.  She is not interested in medication at this time.  She denies any SI/HI.  CSW thinks pt is at high risk of experiencing PP depression.  Signs/symptoms discussed with her.  Pt seems to be bonding with the infant & a have a strong desire to be a good mother.  CSW will continue to follow & assist as needed.          VI SOCIAL WORK PLAN  Type of pt/family education:   If child protective services report - county:   If child protective services report - date:   Information/referral to community resources comment:    Other social work plan:

## 2013-03-02 NOTE — Discharge Summary (Signed)
Attestation of Attending Supervision of Fellow: Evaluation and management procedures were performed by the Fellow under my supervision and collaboration.  I have reviewed the Fellow's note and chart, and I agree with the management and plan.    

## 2013-05-09 ENCOUNTER — Emergency Department (HOSPITAL_COMMUNITY)
Admission: EM | Admit: 2013-05-09 | Discharge: 2013-05-09 | Discharge status: Against Medical Advice | Payer: Medicaid Other

## 2013-07-04 ENCOUNTER — Emergency Department (HOSPITAL_COMMUNITY)
Admission: EM | Admit: 2013-07-04 | Discharge: 2013-07-04 | Disposition: A | Discharge status: Home / Self Care | Payer: Medicaid Other | Attending: Emergency Medicine | Admitting: Emergency Medicine

## 2013-07-04 ENCOUNTER — Encounter (HOSPITAL_COMMUNITY): Payer: Self-pay | Admitting: Emergency Medicine

## 2013-07-04 DIAGNOSIS — Z880 Allergy status to penicillin: Secondary | ICD-10-CM | POA: Insufficient documentation

## 2013-07-04 DIAGNOSIS — N3090 Cystitis, unspecified without hematuria: Secondary | ICD-10-CM | POA: Insufficient documentation

## 2013-07-04 DIAGNOSIS — K029 Dental caries, unspecified: Secondary | ICD-10-CM | POA: Insufficient documentation

## 2013-07-04 DIAGNOSIS — Z8619 Personal history of other infectious and parasitic diseases: Secondary | ICD-10-CM | POA: Insufficient documentation

## 2013-07-04 DIAGNOSIS — K0381 Cracked tooth: Secondary | ICD-10-CM | POA: Insufficient documentation

## 2013-07-04 DIAGNOSIS — Z87891 Personal history of nicotine dependence: Secondary | ICD-10-CM | POA: Insufficient documentation

## 2013-07-04 DIAGNOSIS — K0889 Other specified disorders of teeth and supporting structures: Secondary | ICD-10-CM

## 2013-07-04 DIAGNOSIS — K089 Disorder of teeth and supporting structures, unspecified: Secondary | ICD-10-CM | POA: Insufficient documentation

## 2013-07-04 DIAGNOSIS — Z79899 Other long term (current) drug therapy: Secondary | ICD-10-CM | POA: Insufficient documentation

## 2013-07-04 DIAGNOSIS — K006 Disturbances in tooth eruption: Secondary | ICD-10-CM | POA: Insufficient documentation

## 2013-07-04 DIAGNOSIS — H9209 Otalgia, unspecified ear: Secondary | ICD-10-CM | POA: Insufficient documentation

## 2013-07-04 DIAGNOSIS — N390 Urinary tract infection, site not specified: Secondary | ICD-10-CM | POA: Insufficient documentation

## 2013-07-04 LAB — URINALYSIS, ROUTINE W REFLEX MICROSCOPIC
BILIRUBIN URINE: NEGATIVE
Glucose, UA: NEGATIVE mg/dL
Ketones, ur: NEGATIVE mg/dL
NITRITE: POSITIVE — AB
Protein, ur: NEGATIVE mg/dL
SPECIFIC GRAVITY, URINE: 1.028 (ref 1.005–1.030)
Urobilinogen, UA: 0.2 mg/dL (ref 0.0–1.0)
pH: 5.5 (ref 5.0–8.0)

## 2013-07-04 LAB — URINE MICROSCOPIC-ADD ON

## 2013-07-04 MED ORDER — CLINDAMYCIN HCL 150 MG PO CAPS: 300.0000 mg | ORAL_CAPSULE | Freq: Four times a day (QID) | ORAL | Status: DC

## 2013-07-04 MED ORDER — NAPROXEN 500 MG PO TABS: 500.0000 mg | ORAL_TABLET | Freq: Two times a day (BID) | ORAL | Status: DC

## 2013-07-04 MED ORDER — TRAMADOL HCL 50 MG PO TABS: 50.0000 mg | ORAL_TABLET | Freq: Four times a day (QID) | ORAL | Status: DC | PRN

## 2013-07-04 MED ORDER — NITROFURANTOIN MONOHYD MACRO 100 MG PO CAPS: 100.0000 mg | ORAL_CAPSULE | Freq: Two times a day (BID) | ORAL | Status: DC

## 2013-07-04 NOTE — ED Notes (Signed)
Pt reports dental pain increasing over the last few months, pain radiating to both ears. Also, concerned about r/lower back pain, blood in urine  Today.

## 2013-07-04 NOTE — Discharge Instructions (Signed)
Please read and follow all provided instructions.  Your diagnoses today include:  1. Urinary tract infection   2. Pain, dental     Tests performed today include:  Urine test - suggests that you have an infection in your bladder  Vital signs. See below for your results today.   Medications prescribed:   Macrobid - antibiotic for urinary tract infection  You have been prescribed an antibiotic medicine: take the entire course of medicine even if you are feeling better. Stopping early can cause the antibiotic not to work.  Home care instructions:  Follow any educational materials contained in this packet.  Follow-up instructions: Please follow-up with your primary care provider in 3 days if symptoms are not resolved for further evaluation of your symptoms.  If you do not have a primary care doctor -- see below for referral information.   Return instructions:   Please return to the Emergency Department if you experience worsening symptoms.   Return with fever, worsening pain, persistent vomiting, worsening pain in your back.   Please return if you have any other emergent concerns.    ----------- The exam and treatment you received today has been provided on an emergency basis only. This is not a substitute for complete medical or dental care.  Medications prescribed:   Tramadol - narcotic-like pain medication  DO NOT drive or perform any activities that require you to be awake and alert because this medicine can make you drowsy.    Naproxen - anti-inflammatory pain medication  Do not exceed 500mg  naproxen every 12 hours, take with food  You have been prescribed an anti-inflammatory medication or NSAID. Take with food. Take smallest effective dose for the shortest duration needed for your pain. Stop taking if you experience stomach pain or vomiting.    Clindamycin - antibiotic  Fill this only with worsening facial swelling or if pain persists for more than 72 hours.  You have been prescribed an antibiotic medicine: take the entire course of medicine even if you are feeling better. Stopping early can cause the antibiotic not to work.  Take any prescribed medications only as directed.  Home care instructions:  Follow any educational materials contained in this packet.  Follow-up instructions: Please follow-up with your dentist for further evaluation of your symptoms. If you do not have a dentist or primary care doctor -- see below for referral information.   Dental Assistance: See below for dental referrals  Return instructions:   Please return to the Emergency Department if you experience worsening symptoms.  Please return if you develop a fever, you develop more swelling in your face or neck, you have trouble breathing or swallowing food.  Please return if you have any other emergent concerns.  Additional Information:  Your vital signs today were: BP 119/81   Pulse 73   Temp(Src) 99.1 F (37.3 C) (Oral)   Resp 20   SpO2 99%   LMP 06/20/2013   Breastfeeding? No If your blood pressure (BP) was elevated above 135/85 this visit, please have this repeated by your doctor within one month. -------------- Dental Care: Organization         Address  Phone  Notes  [facility] Department of [facility] [address] [place], [place] [phone] Accepts children up to age 36 who are enrolled in [place] or [place] Health Choice; pregnant women with a Medicaid card; and children who have applied for Medicaid or [place] Health Choice, but were declined, whose parents  can pay a reduced fee at time of service.  [facility] [place]  [address], [place] [phone] Accepts children up to age 30 who are enrolled in [place] or [place] Health Choice; pregnant women with a Medicaid card; and children who have applied for Medicaid or [place] Health Choice, but were declined, whose parents can pay a reduced  fee at time of service.  [facility]  [address], [place] [phone] Patients are seen by appointment only. Walk-ins are not accepted. [facility] will see patients 30 years of age and older. Monday - Tuesday (8am-5pm) Most Wednesdays (8:30-5pm) $30 per visit, cash only  [facility]  [address], [place] [phone] Patients are seen by appointment only. Walk-ins are not accepted. [facility] will see patients 30 years of age and older. One Wednesday Evening (Monthly: Volunteer Based).  $30 per visit, cash only  [facility]  [phone] for adults; Children under age 274, call Graduate Pediatric Dentistry at [phone]. Children aged 30-14, please call [phone] to request a pediatric application.  Dental services are provided in all areas of dental care including fillings, crowns and bridges, complete and partial dentures, implants, gum treatment, root canals, and extractions. Preventive care is also provided. Treatment is provided to both adults and children. Patients are selected via a lottery and there is often a waiting list.   [facility] [address], [place]  [phone] [email or web address]   [facility] [address], [place], [place] [phone], Ext. [phone] Second and Fourth Thursday of each month, opens at 6:30 AM; Clinic ends at 9 AM.  Patients are seen on a first-come first-served basis, and a limited number are seen during each clinic.   [facility]  [address], [place], [place] [phone]   Eligibility Requirements You must have lived in [place], [place], or [place] counties for at least the last three months.   You cannot be eligible for state or federal sponsored [place]healthcare insurance, including CIG[employer]eterans Administration, [place], or [employer]icare.   You generally cannot be eligible for healthcare insurance through  your employer.    How to apply: Eligibility screenings are held every Tuesday and Wednesday afternoon from 1:00 pm until 4:00 pm. You do not need an appointment for the interview!  [facility] [address], [place], [place] [phone]   [facility]  [phone]   [facility]  [phone]   [facility]  [phone]

## 2013-07-04 NOTE — ED Provider Notes (Signed)
CSN: [id number]     Arrival date & time 07/04/13  1440 History  This chart was scribed for non-physician practitioner, Rhea Bleacher, PA-C,working with Audree Camel, MD, by Karle Plumber, ED Scribe.  This patient was seen in room WTR8/WTR8 and the patient's care was started at 3:22 PM.  Chief Complaint  Patient presents with  . Otalgia  . Dental Pain    r./facial pain   The history is provided by the patient. No language interpreter was used.   HPI Comments:  Anita [name] is a 28 y.o. female who presents to the Emergency Department complaining of severe dental pain that starts on the left side and radiates to the right side that started several months ago. She reports associated right ear pain and intermittent facial swelling. She states that she cracked one of her teeth a few months ago while giving birth and now feels as if it may be infected. She states she has been using Orajel and taking Ibuprofen for the pain with moderate relief.  She also reports lower right back pain, dysuria, and hematuria that she noticed earlier today. She denies any fever. She states she has h/o UTI. Pt states she is allergic to Amoxicillin but is unsure of the reaction because it was early in her childhood.  Past Medical History  Diagnosis Date  . GERD (gastroesophageal reflux disease)   . MS (multiple sclerosis)   . Trichomonas   . UTI (lower urinary tract infection)    Past Surgical History  Procedure Laterality Date  . Tonsillectomy    . Adenoidectomy     Family History  Problem Relation Age of Onset  . Heart disease      No family history   History  Substance Use Topics  . Smoking status: Former Smoker    Quit date: 06/17/2012  . Smokeless tobacco: Never Used  . Alcohol Use: No   OB History   Grav Para Term Preterm Abortions TAB SAB Ect Mult Living   1 1 1       1      Review of Systems  Constitutional: Negative for fever.  HENT: Positive for dental problem, ear pain and facial  swelling (intermittent, none currently). Negative for sore throat and trouble swallowing.   Respiratory: Negative for shortness of breath and stridor.   Genitourinary: Positive for dysuria and hematuria.  Musculoskeletal: Positive for back pain (lower right-sided). Negative for neck pain.  Skin: Negative for color change.  Neurological: Negative for headaches.    Allergies  Amoxicillin  Home Medications   Prior to Admission medications   Medication Sig Start Date End Date Taking? Authorizing Provider  ibuprofen (ADVIL,MOTRIN) 600 MG tablet Take 1 tablet (600 mg total) by mouth every 6 (six) hours. 02/22/13   Vale Haven, MD  Prenatal Vit-Fe Fumarate-FA (PRENATAL MULTIVITAMIN) TABS Take 1 tablet by mouth daily at 12 noon.    Historical Provider, MD   Triage Vitals: BP 119/81  Pulse 73  Temp(Src) 99.1 F (37.3 C) (Oral)  Resp 20  SpO2 99%  Physical Exam  Nursing note and vitals reviewed. Constitutional: She appears well-developed and well-nourished.  HENT:  Head: Normocephalic and atraumatic.  Right Ear: Tympanic membrane, external ear and ear canal normal.  Left Ear: Tympanic membrane, external ear and ear canal normal.  Nose: Nose normal.  Mouth/Throat: Uvula is midline, oropharynx is clear and moist and mucous membranes are normal. No trismus in the jaw. Abnormal dentition. Dental caries present. No dental abscesses  or uvula swelling. No tonsillar abscesses.  Left lower 2nd premolar is broken. Right upper 2nd molar is broken. No abscess or gingival erythema.   Eyes: Conjunctivae and EOM are normal. Right eye exhibits no discharge. Left eye exhibits no discharge.  Neck: Normal range of motion. Neck supple.  No neck swelling or Ludwig's angina  Cardiovascular: Normal rate, regular rhythm and normal heart sounds.   Pulmonary/Chest: Effort normal and breath sounds normal.  Abdominal: Soft. There is no tenderness. There is no CVA tenderness.  Musculoskeletal: Normal range of  motion.  Lymphadenopathy:    She has no cervical adenopathy.  Neurological: She is alert.  Skin: Skin is warm and dry.  Psychiatric: She has a normal mood and affect. Her behavior is normal.    ED Course  Procedures (including critical care time) DIAGNOSTIC STUDIES: Oxygen Saturation is 99% on RA, normal by my interpretation.   COORDINATION OF CARE: 3:30 PM- Will prescribe Clindamycin for dental issues. Pt verbalizes understanding and agrees to plan.  Medications - No data to display  Labs Review Labs Reviewed  URINALYSIS, ROUTINE W REFLEX MICROSCOPIC - Abnormal; Notable for the following:    APPearance CLOUDY (*)    Hgb urine dipstick LARGE (*)    Nitrite POSITIVE (*)    Leukocytes, UA MODERATE (*)    All other components within normal limits  URINE MICROSCOPIC-ADD ON - Abnormal; Notable for the following:    Bacteria, UA MANY (*)    All other components within normal limits   Imaging Review No results found.   EKG Interpretation None      Vital signs reviewed and are as follows: Filed Vitals:   07/04/13 1449  BP: 119/81  Pulse: 73  Temp: 99.1 F (37.3 C)  Resp: 20   4:19 PM patient informed of UA results. Will treat urinary tract infection.  Regarding dental pain, patient is to take pain medications and NSAIDs as directed. She is to fill antibiotics with worsening facial swelling or persistent pain. Encouraged patient to followup with dentist for definitive management of her broken teeth. She is to return with worsening symptoms or other concerns.   MDM   Final diagnoses:  Urinary tract infection  Pain, dental   UTI: Uncomplicated cystitis, treat with Macrobid.  Dental pain: Patient with toothache. No fever. Exam unconcerning for Ludwig's angina or other deep tissue infection in neck.    I personally performed the services described in this documentation, which was scribed in my presence. The recorded information has been reviewed and is  accurate.    Renne CriglerJoshua Tjay Velazquez, PA-C 07/04/13 [phone]

## 2013-07-09 NOTE — ED Provider Notes (Signed)
Medical screening examination/treatment/procedure(s) were performed by non-physician practitioner and as supervising physician I was immediately available for consultation/collaboration.   EKG Interpretation None        Audree Camel, MD 07/09/13 [phone]

## 2013-08-12 ENCOUNTER — Encounter (HOSPITAL_COMMUNITY): Payer: Self-pay | Admitting: Emergency Medicine

## 2013-08-12 ENCOUNTER — Inpatient Hospital Stay (HOSPITAL_COMMUNITY)
Admission: EM | Admit: 2013-08-12 | Discharge: 2013-08-17 | DRG: 059 | Disposition: A | Discharge status: Home / Self Care | Payer: Medicaid Other | Attending: Family Medicine | Admitting: Family Medicine

## 2013-08-12 ENCOUNTER — Emergency Department (HOSPITAL_COMMUNITY): Payer: Medicaid Other

## 2013-08-12 DIAGNOSIS — Z833 Family history of diabetes mellitus: Secondary | ICD-10-CM

## 2013-08-12 DIAGNOSIS — N898 Other specified noninflammatory disorders of vagina: Secondary | ICD-10-CM

## 2013-08-12 DIAGNOSIS — K219 Gastro-esophageal reflux disease without esophagitis: Secondary | ICD-10-CM | POA: Diagnosis present

## 2013-08-12 DIAGNOSIS — N390 Urinary tract infection, site not specified: Secondary | ICD-10-CM | POA: Diagnosis present

## 2013-08-12 DIAGNOSIS — L293 Anogenital pruritus, unspecified: Secondary | ICD-10-CM | POA: Diagnosis present

## 2013-08-12 DIAGNOSIS — H469 Unspecified optic neuritis: Secondary | ICD-10-CM | POA: Diagnosis present

## 2013-08-12 DIAGNOSIS — G35 Multiple sclerosis: Secondary | ICD-10-CM | POA: Diagnosis present

## 2013-08-12 DIAGNOSIS — L298 Other pruritus: Secondary | ICD-10-CM | POA: Diagnosis present

## 2013-08-12 DIAGNOSIS — L2989 Other pruritus: Secondary | ICD-10-CM | POA: Diagnosis present

## 2013-08-12 DIAGNOSIS — G245 Blepharospasm: Secondary | ICD-10-CM | POA: Diagnosis present

## 2013-08-12 DIAGNOSIS — Z7982 Long term (current) use of aspirin: Secondary | ICD-10-CM

## 2013-08-12 DIAGNOSIS — Z87891 Personal history of nicotine dependence: Secondary | ICD-10-CM

## 2013-08-12 DIAGNOSIS — Z6838 Body mass index (BMI) 38.0-38.9, adult: Secondary | ICD-10-CM

## 2013-08-12 LAB — URINALYSIS, ROUTINE W REFLEX MICROSCOPIC
Bilirubin Urine: NEGATIVE
Glucose, UA: NEGATIVE mg/dL
Ketones, ur: NEGATIVE mg/dL
Nitrite: POSITIVE — AB
Protein, ur: NEGATIVE mg/dL
Specific Gravity, Urine: 1.027 (ref 1.005–1.030)
Urobilinogen, UA: 0.2 mg/dL (ref 0.0–1.0)
pH: 5.5 (ref 5.0–8.0)

## 2013-08-12 LAB — COMPREHENSIVE METABOLIC PANEL
ALT: 17 U/L (ref 0–35)
AST: 17 U/L (ref 0–37)
Albumin: 3.7 g/dL (ref 3.5–5.2)
Alkaline Phosphatase: 111 U/L (ref 39–117)
BUN: 15 mg/dL (ref 6–23)
CO2: 22 mEq/L (ref 19–32)
Calcium: 9.4 mg/dL (ref 8.4–10.5)
Chloride: 105 mEq/L (ref 96–112)
Creatinine, Ser: 0.85 mg/dL (ref 0.50–1.10)
GFR calc Af Amer: >90 mL/min (ref >90)
GFR calc non Af Amer: >90 mL/min (ref >90)
Glucose, Bld: 86 mg/dL (ref 70–99)
Potassium: 4.1 mEq/L (ref 3.7–5.3)
Sodium: 141 mEq/L (ref 137–147)
Total Bilirubin: <0.2 mg/dL — ABNORMAL LOW (ref 0.3–1.2)
Total Protein: 7.5 g/dL (ref 6.0–8.3)

## 2013-08-12 LAB — CBC WITH DIFFERENTIAL/PLATELET
Basophils Absolute: 0.1 10*3/uL (ref 0.0–0.1)
Basophils Relative: 1 % (ref 0–1)
Eosinophils Absolute: 0.3 10*3/uL (ref 0.0–0.7)
Eosinophils Relative: 3 % (ref 0–5)
HCT: 38.4 % (ref 36.0–46.0)
Hemoglobin: 12.2 g/dL (ref 12.0–15.0)
Lymphocytes Relative: 28 % (ref 12–46)
Lymphs Abs: 2.5 10*3/uL (ref 0.7–4.0)
MCH: 22.3 pg — ABNORMAL LOW (ref 26.0–34.0)
MCHC: 31.8 g/dL (ref 30.0–36.0)
MCV: 70.1 fL — ABNORMAL LOW (ref 78.0–100.0)
Monocytes Absolute: 0.6 10*3/uL (ref 0.1–1.0)
Monocytes Relative: 6 % (ref 3–12)
Neutro Abs: 5.4 10*3/uL (ref 1.7–7.7)
Neutrophils Relative %: 62 % (ref 43–77)
Platelets: 433 10*3/uL — ABNORMAL HIGH (ref 150–400)
RBC: 5.48 MIL/uL — ABNORMAL HIGH (ref 3.87–5.11)
RDW: 15.3 % (ref 11.5–15.5)
WBC: 8.8 10*3/uL (ref 4.0–10.5)

## 2013-08-12 LAB — URINE MICROSCOPIC-ADD ON

## 2013-08-12 LAB — PREGNANCY, URINE: Preg Test, Ur: NEGATIVE

## 2013-08-12 MED ORDER — DEXTROSE 5 % IV SOLN
1.0000 g | Freq: Once | INTRAVENOUS | Status: AC
  Administered 2013-08-12: 1 g via INTRAVENOUS
  Filled 2013-08-12: qty 10

## 2013-08-12 MED ORDER — GADOBENATE DIMEGLUMINE 529 MG/ML IV SOLN
20.0000 mL | Freq: Once | INTRAVENOUS | Status: AC | PRN
  Administered 2013-08-12: 20 mL via INTRAVENOUS

## 2013-08-12 MED ORDER — ACETAMINOPHEN 325 MG PO TABS
650.0000 mg | ORAL_TABLET | Freq: Once | ORAL | Status: AC
  Administered 2013-08-12: 650 mg via ORAL
  Filled 2013-08-12: qty 2

## 2013-08-12 MED ORDER — SODIUM CHLORIDE 0.9 % IV SOLN
500.0000 mg | Freq: Two times a day (BID) | INTRAVENOUS | Status: AC
  Administered 2013-08-13 – 2013-08-17 (×10): 500 mg via INTRAVENOUS
  Filled 2013-08-12 (×10): qty 4

## 2013-08-12 MED ORDER — SODIUM CHLORIDE 0.9 % IV SOLN
500.0000 mg | Freq: Two times a day (BID) | INTRAVENOUS | Status: DC
  Filled 2013-08-12: qty 4

## 2013-08-12 NOTE — ED Provider Notes (Signed)
CSN: [id number]     Arrival date & time 08/12/13  1442 History   First MD Initiated Contact with Patient 08/12/13 1545     Chief Complaint  Patient presents with  . Blurred Vision    r/eye blurred , l/eye "twitching"  . 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  She reports gradual onset of headache this morning.  Headache gradually worsening.  Headache located of the right temporal region.  Headache associated with blurred vision of the right eye and twitching of the left eye.  She reports that this headache feels similar to when she was diagnosed with Pseudotumor Cerebri in the past.  She reports that this was diagnosed at [facility] in 2003.  She states that at that time she was started on IV steroids followed by PO steroids, which completely resolved her symptoms.  She states that she has not had symptoms since that time.  She has taken Aspirin without improvement.  She denies fever, chills, neck pain, neck stiffness, double vision, weakness, numbness, or tingling.  Diagnosis of MS is mentioned in the chart.  When patient was asked about this she states that a Neurologist in [facility] diagnosed her with this in 2003, but she did not believe that it was true.  She states that she was never started on any medication for this.    The history is provided by the patient.    Past Medical History  Diagnosis Date  . GERD (gastroesophageal reflux disease)   . MS (multiple sclerosis)   . Trichomonas   . UTI (lower urinary tract infection)    Past Surgical History  Procedure Laterality Date  . Tonsillectomy    . Adenoidectomy     Family History  Problem Relation Age of Onset  . Heart disease      No family history   History  Substance Use Topics  . Smoking status: Former Smoker    Quit date: 06/17/2012  . Smokeless  tobacco: Never Used  . Alcohol Use: No     Comment: occ   OB History   Grav Para Term Preterm Abortions TAB SAB Ect Mult Living   1 1 1       1      Review of Systems  All other systems reviewed and are negative.     Allergies  Amoxicillin  Home Medications   Prior to Admission medications   Medication Sig Start Date End Date Taking? Authorizing Provider  acetaminophen (TYLENOL) 500 MG tablet Take 1,000 mg by mouth every 6 (six) hours as needed for mild pain.   Yes Historical Provider, MD  aspirin 325 MG tablet Take 325 mg by mouth every 4 (four) hours as needed for moderate pain or headache.   Yes Historical Provider, MD  benzocaine (ORAJEL) 10 % mucosal gel Use as directed 1 application in the mouth or throat as needed for mouth pain.   Yes Historical Provider, MD  Etonogestrel (NEXPLANON [place]) Inject 1 application into the skin.   Yes Historical Provider, MD  Prenatal Vit-Fe Fumarate-FA (PRENATAL MULTIVITAMIN) TABS Take 1 tablet by mouth daily at 12 noon.   Yes Historical Provider, MD  naproxen (NAPROSYN) 500 MG tablet Take 1 tablet (500 mg total) by mouth 2 (two) times daily. 07/04/13   Renne Crigler, PA-C   BP 121/69  Pulse 90  Temp(Src) 99 F (37.2 C) (  Oral)  Wt 229 lb (103.874 kg)  SpO2 100%  Breastfeeding? No Physical Exam  Nursing note and vitals reviewed. Constitutional: She appears well-developed and well-nourished.  HENT:  Head: Normocephalic and atraumatic.  Mouth/Throat: Oropharynx is clear and moist.  Eyes: EOM are normal. Pupils are equal, round, and reactive to light.  Neck: Normal range of motion. Neck supple.  Cardiovascular: Normal rate, regular rhythm and normal heart sounds.   Pulmonary/Chest: Effort normal and breath sounds normal.  Musculoskeletal: Normal range of motion.  Neurological: She is alert. She has normal strength. No cranial nerve deficit or sensory deficit. Gait normal.  Normal gait, no ataxia  Skin: Skin is warm and dry. No rash noted.   Psychiatric: She has a normal mood and affect.    ED Course  LUMBAR PUNCTURE Date/Time: 08/15/2013 12:08 AM Performed by: Santiago GladLAISURE, HEATHER Authorized by: Santiago GladLAISURE, HEATHER Consent: Verbal consent obtained. written consent obtained. Risks and benefits: risks, benefits and alternatives were discussed Consent given by: patient Patient understanding: patient states understanding of the procedure being performed Patient consent: the patient's understanding of the procedure matches consent given Procedure consent: procedure consent matches procedure scheduled Relevant documents: relevant documents present and verified Test results: test results available and properly labeled Site marked: the operative site was marked Required items: required blood products, implants, devices, and special equipment available Patient identity confirmed: verbally with patient Time out: Immediately prior to procedure a "time out" was called to verify the correct patient, procedure, equipment, support staff and site/side marked as required. Indications: evaluation for opening pressure. Anesthesia: local infiltration Local anesthetic: lidocaine 2% with epinephrine Patient sedated: no Preparation: Patient was prepped and draped in the usual sterile fashion. Patient's position: right lateral decubitus Needle type: spinal needle - Quincke tip Number of attempts: 3 Patient tolerance: Patient tolerated the procedure well with no immediate complications. Comments: LP attempted in the ED without success.  CSF not obtained in the ED.   (including critical care time) Labs Review Labs Reviewed  CBC WITH DIFFERENTIAL - Abnormal; Notable for the following:    RBC 5.48 (*)    MCV 70.1 (*)    MCH 22.3 (*)    Platelets 433 (*)    All other components within normal limits  URINALYSIS, ROUTINE W REFLEX MICROSCOPIC - Abnormal; Notable for the following:    APPearance CLOUDY (*)    Hgb urine dipstick TRACE (*)    Nitrite  POSITIVE (*)    Leukocytes, UA SMALL (*)    All other components within normal limits  URINE MICROSCOPIC-ADD ON - Abnormal; Notable for the following:    Bacteria, UA MANY (*)    All other components within normal limits  CSF CULTURE  GRAM STAIN  PREGNANCY, URINE  COMPREHENSIVE METABOLIC PANEL  CSF CELL COUNT WITH DIFFERENTIAL  CSF CELL COUNT WITH DIFFERENTIAL  GLUCOSE, CSF  PROTEIN, CSF    Imaging Review Ct Head Wo Contrast  08/12/2013   CLINICAL DATA:  Poor vision.  Headache.  EXAM: CT HEAD WITHOUT CONTRAST  TECHNIQUE: Contiguous axial images were obtained from the base of the skull through the vertex without intravenous contrast.  COMPARISON:  CT 02/10/2012.  FINDINGS: No mass. No hydrocephalus. No hemorrhage. Visualized orbits and optic globes are unremarkable. No acute bony abnormality identified.  IMPRESSION: No acute abnormality.   Electronically Signed   By: Maisie Fushomas  Register   On: 08/12/2013 15:48     EKG Interpretation None     10:38 PM Discussed with Dr Amada JupiterKirkpatrick.  He  reports that he will come see the patient in the ED.  He is recommending admission to [facility] for IV steroids and treatment of Optic Neuritis.   11:00 PM Discussed with [facility] who has agreed to admit the patient.   MDM   Final diagnoses:  None   Patient presents today with a headache and blurred vision.  She states that in 2003 a Neurologist has diagnosed her with Pseudotumor Cerebri.  She also states that another Neurologist had diagnosed her with MS at that time.   CT head today is negative.  LP attempted by myself and Dr. Criss Alvine in the ED without success.  LP then performed under Fluroscopy, which showed a normal opening pressure.  MRI performed, which showed changed consistent with MS.  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  Patient admitted to [facility] for further management.      Santiago Glad, PA-C 08/15/13 0013

## 2013-08-12 NOTE — Procedures (Signed)
Informed consent was obtained from the patient prior to the procedure, including potential complications of headache, allergy, and pain. With the patient prone, the lower back was prepped with Betadine. 1% Lidocaine was used for local anesthesia. Lumbar puncture was performed at the [L3-L4] level using a [22] gauge needle with return of [clear] CSF with an opening pressure of [12] cm water. [Two]ml of CSF were obtained for laboratory studies. The patient tolerated the procedure well and there were no apparent complications.   IMPRESSION: [Slow flow of CSF fluid. Normal opening pressure. Findings are a inconsistent with pseudotumor cerebri.

## 2013-08-12 NOTE — ED Notes (Signed)
Consent signed for LP procedure

## 2013-08-12 NOTE — ED Notes (Signed)
Pt reports dental pain in r/lower jaw. Also c/o blurred vision in r/eye, twitching in l/eye

## 2013-08-12 NOTE — H&P (Signed)
PCP:  No primary Anita [name] on file.    Chief Complaint:  blurred vision  HPI: Anita [name] is a 28 y.o. female   has a past medical history of GERD (gastroesophageal reflux disease); MS (multiple sclerosis); Trichomonas; and UTI (lower urinary tract infection).   Presented with  Blurred vision started today associated with Headache. 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 She was treated with possible steroids. First IV then PO for about a week and her symptoms resolved at that time. She has been symptom free for the past 12 years except seeing some little floaters she was evaluated with LP at the time and that showed normal opening presure. Patient had a healthy pregnancy 5 months ago. Patient have been noticing that she has lost balance while taking hot showers if her eyes are closed.  Patient have developed blurred vision for the past 3 days that has gotten worse today. She presented to ER and had an LP that showed normal opening pressure. MRI ws done and showed evidence of MS. Neurology consult has been called and patient is being admitted for IV steroids.   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  Review of Systems:    Pertinent positives include:  Blurry vision, eye twitching, headache, tooth ache  Constitutional:  No weight loss, night sweats, Fevers, chills, fatigue, weight loss  HEENT:  No headaches, Difficulty swallowing,Tooth/dental problems,Sore throat,  No sneezing, itching, ear ache, nasal congestion, post nasal drip,  Cardio-vascular:  No chest pain, Orthopnea, PND, anasarca, dizziness, palpitations.no Bilateral lower extremity swelling  GI:  No heartburn, indigestion, abdominal pain, nausea, vomiting, diarrhea, change in bowel habits, loss of appetite, melena, blood in stool, hematemesis Resp:  no shortness of breath at rest. No dyspnea on exertion, No excess mucus, no  productive cough, No non-productive cough, No coughing up of blood.No change in color of mucus.No wheezing. Skin:  no rash or lesions. No jaundice GU:  no dysuria, change in color of urine, no urgency or frequency. No straining to urinate.  No flank pain.  Musculoskeletal:  No joint pain or no joint swelling. No decreased range of motion. No back pain.  Psych:  No change in mood or affect. No depression or anxiety. No memory loss.  Neuro: no localizing neurological complaints, no tingling, no weakness, no double vision, no gait abnormality, no slurred speech, no confusion  Otherwise ROS are negative except for above, 10 systems were reviewed  Past Medical History: Past Medical History  Diagnosis Date  . GERD (gastroesophageal reflux disease)   . MS (multiple sclerosis)   . Trichomonas   . UTI (lower urinary tract infection)    Past Surgical History  Procedure Laterality Date  . Tonsillectomy    . Adenoidectomy       Medications: Prior to Admission medications   Medication Sig Start Date End Date Taking? Authorizing Anita [name]  acetaminophen (TYLENOL) 500 MG tablet Take 1,000 mg by mouth every 6 (six) hours as needed for mild pain.   Yes Historical Anita Cassel, MD  aspirin 325 MG tablet Take 325 mg by mouth every 4 (four) hours as needed for moderate pain or headache.   Yes Historical Anita Savich, MD  Prenatal Vit-Fe Fumarate-FA (PRENATAL MULTIVITAMIN) TABS Take 1 tablet by mouth daily at 12 noon.   Yes Historical Anita Dooling, MD    Allergies:   Allergies  Allergen Reactions  . Amoxicillin Other (See  Comments)    Childhood reaction, doesn't remember    Social History:  Ambulatory  independently   Lives at home  With family     reports that she quit smoking about 13 months ago. She has never used smokeless tobacco. She reports that she does not drink alcohol or use illicit drugs.    Family History: family history includes Diabetes type II in her mother; Heart disease in an  other family member.    Physical Exam: Patient Vitals for the past 24 hrs:  BP Temp Temp src Pulse Resp SpO2 Weight  08/12/13 1947 112/63 mmHg - - 80 20 99 % -  08/12/13 1532 121/69 mmHg 99 F (37.2 C) Oral 90 - 100 % 103.874 kg (229 lb)    1. General:  in No Acute distress 2. Psychological: Alert and  Oriented 3. Head/ENT:   Moist  Mucous Membranes                          Head Non traumatic, neck supple                          Normal  Dentition 4. SKIN: normal    Skin turgor,  Skin clean Dry and intact no rash 5. Heart: Regular rate and rhythm no Murmur, Rub or gallop 6. Lungs: Clear to auscultation bilaterally, no wheezes or crackles   7. Abdomen: Soft, non-tender, Non distended 8. Lower extremities: no clubbing, cyanosis, or edema 9. Neurologically strength intact CN 2-12 intact 10. MSK: Normal range of motion  body mass index is 38.11 kg/(m^2).   Labs on Admission:   Recent Labs  08/12/13 1610  NA 141  K 4.1  CL 105  CO2 22  GLUCOSE 86  BUN 15  CREATININE 0.85  CALCIUM 9.4    Recent Labs  08/12/13 1610  AST 17  ALT 17  ALKPHOS 111  BILITOT <0.2*  PROT 7.5  ALBUMIN 3.7   No results found for this basename: LIPASE, AMYLASE,  in the last 72 hours  Recent Labs  08/12/13 1610  WBC 8.8  NEUTROABS 5.4  HGB 12.2  HCT 38.4  MCV 70.1*  PLT 433*   No results found for this basename: CKTOTAL, CKMB, CKMBINDEX, TROPONINI,  in the last 72 hours No results found for this basename: TSH, T4TOTAL, FREET3, T3FREE, THYROIDAB,  in the last 72 hours No results found for this basename: VITAMINB12, FOLATE, FERRITIN, TIBC, IRON, RETICCTPCT,  in the last 72 hours No results found for this basename: HGBA1C    The CrCl is unknown because both a height and weight (above a minimum accepted value) are required for this calculation. ABG    Component Value Date/Time   TCO2 24 07/30/2012 1129     No results found for this basename: DDIMER      UA many bacteria but no  WBC's not a clean sample  BNP (last 3 results) No results found for this basename: PROBNP,  in the last 8760 hours  Filed Weights   08/12/13 1532  Weight: 103.874 kg (229 lb)     Cultures:    Component Value Date/Time   SDES VAGINAL/RECTAL 02/10/2013 1735   SPECREQUEST NONE 02/10/2013 1735   CULT  Value: GROUP B STREP(S.AGALACTIAE)ISOLATED Note: TESTING AGAINST S. AGALACTIAE NOT ROUTINELY PERFORMED DUE TO PREDICTABILITY OF AMP/PEN/VAN SUSCEPTIBILITY. Performed at [facility] 02/10/2013 1735   REPTSTATUS 02/12/2013 FINAL 02/10/2013 1735  Radiological Exams on Admission: Ct Head Wo Contrast  08/12/2013   CLINICAL DATA:  Poor vision.  Headache.  EXAM: CT HEAD WITHOUT CONTRAST  TECHNIQUE: Contiguous axial images were obtained from the base of the skull through the vertex without intravenous contrast.  COMPARISON:  CT 02/10/2012.  FINDINGS: No mass. No hydrocephalus. No hemorrhage. Visualized orbits and optic globes are unremarkable. No acute bony abnormality identified.  IMPRESSION: No acute abnormality.   Electronically Signed   By: Maisie Fus  Register   On: 08/12/2013 15:48   Mr Brain W Wo Contrast  08/12/2013   CLINICAL DATA:  BLURRED VISION DENTAL PAIN  EXAM: MRI HEAD WITHOUT AND WITH CONTRAST  TECHNIQUE: Multiplanar, multiecho pulse sequences of the brain and surrounding structures were obtained without and with intravenous contrast.  CONTRAST:  20mL MULTIHANCE GADOBENATE DIMEGLUMINE 529 MG/ML IV SOLN  COMPARISON:  Prior CT performed earlier on the same day.  FINDINGS: There is mild cerebral atrophy for patient age.  Multiple T2/FLAIR hyperintense foci are seen involving the periventricular and deep white matter of both cerebral hemispheres. Many of these are oriented perpendicular to the lateral ventricles and demonstrate hypo intense precontrast T1 signal intensity. A few scattered subcentimeter foci seen within the subcortical left occipital lobe (series 7, image 11). Findings  are most consistent with provided history of multiple sclerosis. No infratentorial lesions. No enhancing lesions or abnormal restricted diffusion to suggest active demyelination identified. Optic nerves are symmetric without abnormal enhancement.  No mass lesion, midline shift, or extra-axial fluid collection. Ventricles are normal in size without evidence of hydrocephalus.  No diffusion-weighted signal abnormality is identified to suggest acute intracranial infarct. Gray-white matter differentiation is maintained. Normal flow voids are seen within the intracranial vasculature. No intracranial hemorrhage identified.  The cervicomedullary junction is normal. Pituitary gland is within normal limits. Pituitary stalk is midline. The globes and optic nerves demonstrate a normal appearance with normal signal intensity.  The bone marrow signal intensity is normal. Calvarium is intact. Visualized upper cervical spine is within normal limits.  Scalp soft tissues are unremarkable.  Mild polypoid mucosal thickening present within the right maxillary and sphenoid sinuses. The paranasal sinuses are otherwise clear. No mastoid effusion.  IMPRESSION: 1. Multiple T2/FLAIR hyperintense lesions involving the supratentorial white white matter, with overall distribution compatible with provided history of multiple sclerosis. No enhancing lesion or abnormal restricted diffusion to suggest acute MS flare. 2. No other acute intracranial process identified. 3. Mild right maxillary and right sphenoid sinus disease.   Electronically Signed   By: Rise Mu M.D.   On: 08/12/2013 22:24   Dg Lumbar Puncture Fluoro Guide  08/12/2013   CLINICAL DATA:  Patient with eye pain and blurred vision. Concern for pseudotumor cerebral. Normal opening pressure on lumbar puncture of 02/10/2012  EXAM: DIAGNOSTIC LUMBAR PUNCTURE UNDER FLUOROSCOPIC GUIDANCE  FLUOROSCOPY TIME:  1 min 31 seconds  PROCEDURE: Informed consent was obtained from the  patient prior to the procedure, including potential complications of headache, allergy, and pain. With the patient prone, the lower back was prepped with Betadine. 1% Lidocaine was used for local anesthesia. Lumbar puncture was performed at the L3-L4 level using a 22 gauge needle with return of clear CSF with an opening pressure of 12 cm water. Twoml of CSF were obtained for laboratory studies. The patient tolerated the procedure well and there were no apparent complications.  IMPRESSION: Slow flow of CSF fluid. Normal opening pressure. Findings are a inconsistent with pseudotumor cerebri.  Findings conveyed toSCOTT GOLDSTON  on 08/12/2013  at18:40.   Electronically Signed   By: Genevive Bi M.D.   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 MS (multiple sclerosis) - admit for IV steroids, neurology consult . 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  I have spent a total of 55 min on this admission  DOUTOVA,ANASTASSIA 08/12/2013, 11:31 PM  [facility]  Pager [phone]   If 7AM-7PM, please contact the day team taking care of the patient  [email or web address]  Password [email or web address]

## 2013-08-13 DIAGNOSIS — G35 Multiple sclerosis: Principal | ICD-10-CM

## 2013-08-13 LAB — COMPREHENSIVE METABOLIC PANEL
ALT: 16 U/L (ref 0–35)
AST: 15 U/L (ref 0–37)
Albumin: 3.4 g/dL — ABNORMAL LOW (ref 3.5–5.2)
Alkaline Phosphatase: 112 U/L (ref 39–117)
BILIRUBIN TOTAL: 0.4 mg/dL (ref 0.3–1.2)
BUN: 13 mg/dL (ref 6–23)
CHLORIDE: 104 meq/L (ref 96–112)
CO2: 21 mEq/L (ref 19–32)
Calcium: 9.2 mg/dL (ref 8.4–10.5)
Creatinine, Ser: 0.68 mg/dL (ref 0.50–1.10)
GFR calc Af Amer: >90 mL/min (ref >90)
GFR calc non Af Amer: >90 mL/min (ref >90)
Glucose, Bld: 122 mg/dL — ABNORMAL HIGH (ref 70–99)
POTASSIUM: 4.1 meq/L (ref 3.7–5.3)
Sodium: 138 mEq/L (ref 137–147)
TOTAL PROTEIN: 7.1 g/dL (ref 6.0–8.3)

## 2013-08-13 LAB — CBC
HEMATOCRIT: 36.3 % (ref 36.0–46.0)
Hemoglobin: 11.6 g/dL — ABNORMAL LOW (ref 12.0–15.0)
MCH: 22.4 pg — AB (ref 26.0–34.0)
MCHC: 32 g/dL (ref 30.0–36.0)
MCV: 70.1 fL — AB (ref 78.0–100.0)
PLATELETS: 350 10*3/uL (ref 150–400)
RBC: 5.18 MIL/uL — ABNORMAL HIGH (ref 3.87–5.11)
RDW: 15.2 % (ref 11.5–15.5)
WBC: 9.6 10*3/uL (ref 4.0–10.5)

## 2013-08-13 LAB — URINALYSIS, ROUTINE W REFLEX MICROSCOPIC
Bilirubin Urine: NEGATIVE
Glucose, UA: NEGATIVE mg/dL
Hgb urine dipstick: NEGATIVE
Ketones, ur: NEGATIVE mg/dL
LEUKOCYTES UA: NEGATIVE
NITRITE: POSITIVE — AB
PH: 5.5 (ref 5.0–8.0)
PROTEIN: NEGATIVE mg/dL
Specific Gravity, Urine: 1.037 — ABNORMAL HIGH (ref 1.005–1.030)
Urobilinogen, UA: 0.2 mg/dL (ref 0.0–1.0)

## 2013-08-13 LAB — URINE MICROSCOPIC-ADD ON

## 2013-08-13 LAB — MAGNESIUM: MAGNESIUM: 1.9 mg/dL (ref 1.5–2.5)

## 2013-08-13 LAB — GLUCOSE, CAPILLARY
GLUCOSE-CAPILLARY: 239 mg/dL — AB (ref 70–99)
Glucose-Capillary: 163 mg/dL — ABNORMAL HIGH (ref 70–99)

## 2013-08-13 LAB — TSH: TSH: 0.948 u[IU]/mL (ref 0.350–4.500)

## 2013-08-13 LAB — PHOSPHORUS: PHOSPHORUS: 2.7 mg/dL (ref 2.3–4.6)

## 2013-08-13 MED ORDER — ONDANSETRON HCL 4 MG PO TABS: 4.0000 mg | ORAL_TABLET | Freq: Four times a day (QID) | ORAL | Status: DC | PRN

## 2013-08-13 MED ORDER — ACETAMINOPHEN 325 MG PO TABS
650.0000 mg | ORAL_TABLET | Freq: Four times a day (QID) | ORAL | Status: DC | PRN
  Administered 2013-08-16: 650 mg via ORAL
  Filled 2013-08-13: qty 2

## 2013-08-13 MED ORDER — SODIUM CHLORIDE 0.9 % IV SOLN: 250.0000 mL | INTRAVENOUS | Status: DC | PRN

## 2013-08-13 MED ORDER — ONDANSETRON HCL 4 MG/2ML IJ SOLN
4.0000 mg | Freq: Four times a day (QID) | INTRAMUSCULAR | Status: DC | PRN
  Filled 2013-08-13: qty 2

## 2013-08-13 MED ORDER — DOCUSATE SODIUM 100 MG PO CAPS
100.0000 mg | ORAL_CAPSULE | Freq: Two times a day (BID) | ORAL | Status: DC
  Administered 2013-08-16 – 2013-08-17 (×2): 100 mg via ORAL
  Filled 2013-08-13 (×10): qty 1

## 2013-08-13 MED ORDER — PANTOPRAZOLE SODIUM 40 MG PO TBEC
40.0000 mg | DELAYED_RELEASE_TABLET | Freq: Every day | ORAL | Status: DC
  Administered 2013-08-13 – 2013-08-17 (×5): 40 mg via ORAL
  Filled 2013-08-13 (×5): qty 1

## 2013-08-13 MED ORDER — HYDROCODONE-ACETAMINOPHEN 5-325 MG PO TABS
1.0000 | ORAL_TABLET | ORAL | Status: DC | PRN
  Administered 2013-08-13 (×2): 2 via ORAL
  Administered 2013-08-13: 1 via ORAL
  Administered 2013-08-14 – 2013-08-17 (×6): 2 via ORAL
  Filled 2013-08-13 (×8): qty 2
  Filled 2013-08-13: qty 1

## 2013-08-13 MED ORDER — DEXTROSE 5 % IV SOLN
1.0000 g | INTRAVENOUS | Status: DC
  Administered 2013-08-13 – 2013-08-14 (×2): 1 g via INTRAVENOUS
  Filled 2013-08-13 (×2): qty 10

## 2013-08-13 MED ORDER — ACETAMINOPHEN 650 MG RE SUPP: 650.0000 mg | Freq: Four times a day (QID) | RECTAL | Status: DC | PRN

## 2013-08-13 MED ORDER — SODIUM CHLORIDE 0.9 % IJ SOLN: 3.0000 mL | INTRAMUSCULAR | Status: DC | PRN

## 2013-08-13 MED ORDER — SODIUM CHLORIDE 0.9 % IJ SOLN
3.0000 mL | Freq: Two times a day (BID) | INTRAMUSCULAR | Status: DC
  Administered 2013-08-13 – 2013-08-17 (×10): 3 mL via INTRAVENOUS

## 2013-08-13 NOTE — Consult Note (Signed)
Neurology Consultation Reason for Consult: Blurred vision Referring Physician: Alric Ran  CC: Blurred vision  History is obtained from: Patient  HPI: Anita [name] is a 28 y.o. female with a history of several episodes of painful vision loss, initially bilateral in 2003 followed by right-sided several years later. Over the past 2 days, she has had a right-sided headache as well as right-sided blurred vision which is worsened today compared to Tuesday. She states that when she looks at things, they are different colors of each eye appearing darker out of her right eye. She has worse pain with movement of her eyes.  She was initially told that she might have pseudotumor, though I am not certain how this diagnosis was actually made. She then saw a neurologist in [place] who told her that she had multiple sclerosis, but was not confident that time and therefore did not continue followup.   She denies any episodes of numbness or weakness or other transient symptoms other than vision loss. She denies any history of difficulty walking. She had an LP with normal opening pressure.    ROS: A 14 point ROS was performed and is negative except as noted in the HPI.  Past Medical History  Diagnosis Date  . GERD (gastroesophageal reflux disease)   . MS (multiple sclerosis)   . Trichomonas   . UTI (lower urinary tract infection)     Family History: No history of autoimmune disease  Social History: Tob: Denies  Exam: Current vital signs: BP 112/63  Pulse 80  Temp(Src) 99 F (37.2 C) (Oral)  Resp 20  Wt 103.874 kg (229 lb)  SpO2 99%  Breastfeeding? No Vital signs in last 24 hours: Temp:  [99 F (37.2 C)] 99 F (37.2 C) (06/25 1532) Pulse Rate:  [80-90] 80 (06/25 1947) Resp:  [20] 20 (06/25 1947) BP: (112-121)/(63-69) 112/63 mmHg (06/25 1947) SpO2:  [99 %-100 %] 99 % (06/25 1947) Weight:  [103.874 kg (229 lb)] 103.874 kg (229 lb) (06/25 1532)  General: In bed, NAD CV: Regular  rate and rhythm Mental Status: Patient is awake, alert, oriented to person, place, month, year, and situation. Immediate and remote memory are intact. Patient is able to give a clear and coherent history. No signs of aphasia or neglect Cranial Nerves: II: Visual Fields are full. Pupils are equal, round, and reactive to light. She has an afferent pupillary defect on the right Discs are difficult to visualize. III,IV, VI: EOMI without ptosis or diploplia.  V: Facial sensation is decreased to pen and temperature on the left VII: Facial movement is symmetric.  VIII: hearing is intact to voice X: Uvula elevates symmetrically XI: Shoulder shrug is symmetric. XII: tongue is midline without atrophy or fasciculations.  Motor: Tone is normal. Bulk is normal. 5/5 strength was present in all four extremities.  Sensory: Sensation is decreased to pin and temperature on the left Deep Tendon Reflexes: 2+ and symmetric in the biceps and patellae.  Plantars: Toes are downgoing bilaterally.  Cerebellar: FNF and HKS are intact bilaterally Gait: Not assessed due to multiple medical monitors in ED setting.  I have reviewed labs in epic and the results pertinent to this consultation are: CMP, CBC unremarkable   I have reviewed the images obtained: MRI brain multiple T2 lesions that would be consistent with demyelinating disease.  Impression: 28 year old female with likely multiple sclerosis given multiple recurrent episodes of optic neuritis. 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 Her current presentation is consistent with optic neuritis and she'll need to pursue a course of IV steroids.  Recommendations: 1) Solu-Medrol 1 g per day for 5 days(this can be divided twice a day or given a single dose) 2) monitor blood glucose 3) Protonix for prophylaxis while on steroids 4) if glucoses appear stable and CSF results  benign, would consider finishing IV steroids with home health. 5) oligoclonal bands   Ritta SlotMcNeill Kirkpatrick, MD [facility] [phone]  If 7pm- 7am, please page neurology on call as listed in AMION.

## 2013-08-13 NOTE — Progress Notes (Signed)
Pt seen and evaluated earlier this AM by my associate. Please refer to her H and P for details regarding assessment and plan.  [employer], [employer]

## 2013-08-13 NOTE — Progress Notes (Deleted)
Pt. A&Ox4. VSS on arrival to floor pt continues to complain of blurred vision to R eye and twitching to L eye. Also c/o HA and was medicated with good results. Pt currently resting well in no distress will continue to monitor.

## 2013-08-13 NOTE — Care Management Note (Unsigned)
    Page 1 of 1   08/13/2013     11:55:30 AM CARE MANAGEMENT NOTE 08/13/2013  Patient:  Anita [name],Anita [name]   Account Number:  [id number]  Date Initiated:  08/13/2013  Documentation initiated by:  [facility]  Subjective/Objective Assessment:   28 year old female admitted with blurred vision, does have a hx of MS.     Action/Plan:   From home.   Anticipated DC Date:  08/16/2013   Anticipated DC Plan:  HOME/SELF CARE      DC Planning Services  CM consult      Choice offered to / List presented to:             Status of service:  Completed, signed off Medicare Important Message given?   (If response is "NO", the following Medicare IM given date fields will be blank) Date Medicare IM given:   Date Additional Medicare IM given:    Discharge Disposition:    Per UR Regulation:  Reviewed for med. necessity/level of care/duration of stay  If discussed at Long Length of Stay Meetings, dates discussed:    Comments:  08/13/13 Algernon Huxley RN BSN [phone] No d/c needs anticipated at this time, will continue to follow.

## 2013-08-13 NOTE — Progress Notes (Signed)
Pt. A&OX4 VSS on arrival to floor pt continues to c/o blurred vision to R eye and twitching to L eye pt c/o headache and was medicated with good results. Pt currently resting well in no distress will continue to monitor.

## 2013-08-14 DIAGNOSIS — N390 Urinary tract infection, site not specified: Secondary | ICD-10-CM | POA: Diagnosis present

## 2013-08-14 LAB — URINE CULTURE
CULTURE: NO GROWTH
Colony Count: NO GROWTH
SPECIAL REQUESTS: NORMAL

## 2013-08-14 LAB — GLUCOSE, CAPILLARY
GLUCOSE-CAPILLARY: 147 mg/dL — AB (ref 70–99)
Glucose-Capillary: 128 mg/dL — ABNORMAL HIGH (ref 70–99)
Glucose-Capillary: 140 mg/dL — ABNORMAL HIGH (ref 70–99)
Glucose-Capillary: 227 mg/dL — ABNORMAL HIGH (ref 70–99)

## 2013-08-14 NOTE — Progress Notes (Signed)
[facility] PROGRESS NOTE  ZYLA ZARZA KXF:[id number] DOB: April 17, 1985 DOA: 08/12/2013 PCP: No primary Shemeca Lukasik on file.  Assessment/Plan: Active Problems:   MS (multiple sclerosis)/Optic neuritis due to multiple sclerosis - Will f/u with Neurology's recommendations outlined in their consult note. - Assess blood glucose levels within the next 24 hours to help with discharge planning.  UTI - urine culture - Continue Rocephin   Code Status: full Family Communication: none at bedside Disposition Plan: Pending blood sugar levels. May go home with home health and high dose steroid dose   Consultants:  Neurology  Procedures:  none  Antibiotics:  none   HPI/Subjective: No new complaints. No acute issues reported overnight.  Objective: Filed Vitals:   08/14/13 0544  BP: 108/67  Pulse: 75  Temp: 98.2 F (36.8 C)  Resp: 18   No intake or output data in the 24 hours ending 08/14/13 1230 Filed Weights   08/12/13 1532 08/13/13 0132  Weight: 103.874 kg (229 lb) 108.636 kg (239 lb 8 oz)    Exam:   General:  Pt in NAD, alert and awake  Cardiovascular: RRR, no MRG  Respiratory: CTA BL, no wheezes  Abdomen: soft, NT, nD  Musculoskeletal: no cyanosis or clubbing   Data Reviewed: Basic Metabolic Panel:  Recent Labs Lab 08/12/13 1610 08/13/13 0520  NA 141 138  K 4.1 4.1  CL 105 104  CO2 22 21  GLUCOSE 86 122*  BUN 15 13  CREATININE 0.85 0.68  CALCIUM 9.4 9.2  MG  --  1.9  PHOS  --  2.7   Liver Function Tests:  Recent Labs Lab 08/12/13 1610 08/13/13 0520  AST 17 15  ALT 17 16  ALKPHOS 111 112  BILITOT <0.2* 0.4  PROT 7.5 7.1  ALBUMIN 3.7 3.4*   No results found for this basename: LIPASE, AMYLASE,  in the last 168 hours No results found for this basename: AMMONIA,  in the last 168 hours CBC:  Recent Labs Lab 08/12/13 1610 08/13/13 0520  WBC 8.8 9.6  NEUTROABS 5.4  --   HGB 12.2 11.6*  HCT 38.4 36.3  MCV 70.1* 70.1*  PLT 433* 350    Cardiac Enzymes: No results found for this basename: CKTOTAL, CKMB, CKMBINDEX, TROPONINI,  in the last 168 hours BNP (last 3 results) No results found for this basename: PROBNP,  in the last 8760 hours CBG:  Recent Labs Lab 08/13/13 0733 08/13/13 1645 08/13/13 2357 08/14/13 0730  GLUCAP 163* 239* 147* 140*    Recent Results (from the past 240 hour(s))  URINE CULTURE     Status: None   Collection Time    08/13/13  5:50 AM      Result Value Ref Range Status   Specimen Description URINE, CLEAN CATCH   Final   Special Requests Normal   Final   Culture  Setup Time     Final   Value: 08/13/2013 08:19     Performed at [facility] Count     Final   Value: NO GROWTH     Performed at [facility]   Culture     Final   Value: NO GROWTH     Performed at [facility]   Report Status 08/14/2013 FINAL   Final     Studies: Ct Head Wo Contrast  08/12/2013   CLINICAL DATA:  Poor vision.  Headache.  EXAM: CT HEAD WITHOUT CONTRAST  TECHNIQUE: Contiguous axial images were obtained from the  base of the skull through the vertex without intravenous contrast.  COMPARISON:  CT 02/10/2012.  FINDINGS: No mass. No hydrocephalus. No hemorrhage. Visualized orbits and optic globes are unremarkable. No acute bony abnormality identified.  IMPRESSION: No acute abnormality.   Electronically Signed   By: Maisie Fus  Register   On: 08/12/2013 15:48   Mr Brain W Wo Contrast  08/12/2013   CLINICAL DATA:  BLURRED VISION DENTAL PAIN  EXAM: MRI HEAD WITHOUT AND WITH CONTRAST  TECHNIQUE: Multiplanar, multiecho pulse sequences of the brain and surrounding structures were obtained without and with intravenous contrast.  CONTRAST:  20mL MULTIHANCE GADOBENATE DIMEGLUMINE 529 MG/ML IV SOLN  COMPARISON:  Prior CT performed earlier on the same day.  FINDINGS: There is mild cerebral atrophy for patient age.  Multiple T2/FLAIR hyperintense foci are seen involving the periventricular and deep  white matter of both cerebral hemispheres. Many of these are oriented perpendicular to the lateral ventricles and demonstrate hypo intense precontrast T1 signal intensity. A few scattered subcentimeter foci seen within the subcortical left occipital lobe (series 7, image 11). Findings are most consistent with provided history of multiple sclerosis. No infratentorial lesions. No enhancing lesions or abnormal restricted diffusion to suggest active demyelination identified. Optic nerves are symmetric without abnormal enhancement.  No mass lesion, midline shift, or extra-axial fluid collection. Ventricles are normal in size without evidence of hydrocephalus.  No diffusion-weighted signal abnormality is identified to suggest acute intracranial infarct. Gray-white matter differentiation is maintained. Normal flow voids are seen within the intracranial vasculature. No intracranial hemorrhage identified.  The cervicomedullary junction is normal. Pituitary gland is within normal limits. Pituitary stalk is midline. The globes and optic nerves demonstrate a normal appearance with normal signal intensity.  The bone marrow signal intensity is normal. Calvarium is intact. Visualized upper cervical spine is within normal limits.  Scalp soft tissues are unremarkable.  Mild polypoid mucosal thickening present within the right maxillary and sphenoid sinuses. The paranasal sinuses are otherwise clear. No mastoid effusion.  IMPRESSION: 1. Multiple T2/FLAIR hyperintense lesions involving the supratentorial white white matter, with overall distribution compatible with provided history of multiple sclerosis. No enhancing lesion or abnormal restricted diffusion to suggest acute MS flare. 2. No other acute intracranial process identified. 3. Mild right maxillary and right sphenoid sinus disease.   Electronically Signed   By: Rise Mu M.D.   On: 08/12/2013 22:24   Dg Lumbar Puncture Fluoro Guide  08/12/2013   CLINICAL DATA:   Patient with eye pain and blurred vision. Concern for pseudotumor cerebral. Normal opening pressure on lumbar puncture of 02/10/2012  EXAM: DIAGNOSTIC LUMBAR PUNCTURE UNDER FLUOROSCOPIC GUIDANCE  FLUOROSCOPY TIME:  1 min 31 seconds  PROCEDURE: Informed consent was obtained from the patient prior to the procedure, including potential complications of headache, allergy, and pain. With the patient prone, the lower back was prepped with Betadine. 1% Lidocaine was used for local anesthesia. Lumbar puncture was performed at the L3-L4 level using a 22 gauge needle with return of clear CSF with an opening pressure of 12 cm water. Twoml of CSF were obtained for laboratory studies. The patient tolerated the procedure well and there were no apparent complications.  IMPRESSION: Slow flow of CSF fluid. Normal opening pressure. Findings are a inconsistent with pseudotumor cerebri.  Findings conveyed toSCOTT GOLDSTON on 08/12/2013  at18:40.   Electronically Signed   By: Genevive Bi M.D.   On: 08/12/2013 18:45    Scheduled Meds: . cefTRIAXone (ROCEPHIN)  IV  1 g Intravenous  Q24H  . docusate sodium  100 mg Oral BID  . methylPREDNISolone (SOLU-MEDROL) injection  500 mg Intravenous BID  . pantoprazole  40 mg Oral Q1200  . sodium chloride  3 mL Intravenous Q12H   Continuous Infusions:    Time spent: > 35 minutes    Penny PiaVEGA, [place]  [facility] Pager [phone]. If 7PM-7AM, please contact night-coverage at [email or web address], password [email or web address] 08/14/2013, 12:30 PM  LOS: 2 days

## 2013-08-15 DIAGNOSIS — N898 Other specified noninflammatory disorders of vagina: Secondary | ICD-10-CM

## 2013-08-15 LAB — GLUCOSE, CAPILLARY
Glucose-Capillary: 277 mg/dL — ABNORMAL HIGH (ref 70–99)
Glucose-Capillary: 183 mg/dL — ABNORMAL HIGH (ref 70–99)

## 2013-08-15 MED ORDER — FLUCONAZOLE 150 MG PO TABS
150.0000 mg | ORAL_TABLET | Freq: Once | ORAL | Status: AC
  Administered 2013-08-15: 150 mg via ORAL
  Filled 2013-08-15: qty 1

## 2013-08-15 NOTE — Progress Notes (Signed)
[facility] PROGRESS NOTE  Anita [name] DOB: [birth year] DOA: 08/12/2013 PCP: No primary provider on file.  Assessment/Plan: Active Problems:   MS (multiple sclerosis)/Optic neuritis due to multiple sclerosis - Will f/u with Neurology's recommendations outlined in their consult note. - Assess blood glucose levels within the next 24 hours to help with discharge planning.  UTI - urine culture - Continue Rocephin  Vaginal itching and discharge - in context of recent antibiotic use. - Suspect vaginal candidiasis.  Will try one time dose of diflucan which patient is agreeable to taking at this time.  Code Status: full Family Communication: none at bedside Disposition Plan: Pending blood sugar levels. May go home with home health and high dose steroid dose   Consultants:  Neurology  Procedures:  none  Antibiotics:  none   Antifungal - Diflucan  HPI/Subjective: No new complaints. No acute issues reported overnight.  Objective: Filed Vitals:   08/15/13 1458  BP: 127/82  Pulse: 68  Temp: 99.2 F (37.3 C)  Resp: 16   No intake or output data in the 24 hours ending 08/15/13 1656 Filed Weights   08/12/13 1532 08/13/13 0132  Weight: 103.874 kg (229 lb) 108.636 kg (239 lb 8 oz)    Exam:   General:  Pt in NAD, alert and awake  Cardiovascular: RRR, no MRG  Respiratory: CTA BL, no wheezes  Abdomen: soft, NT, nD  Musculoskeletal: no cyanosis or clubbing   Data Reviewed: Basic Metabolic Panel:  Recent Labs Lab 08/12/13 1610 08/13/13 0520  NA 141 138  K 4.1 4.1  CL 105 104  CO2 22 21  GLUCOSE 86 122*  BUN 15 13  CREATININE 0.85 0.68  CALCIUM 9.4 9.2  MG  --  1.9  PHOS  --  2.7   Liver Function Tests:  Recent Labs Lab 08/12/13 1610 08/13/13 0520  AST 17 15  ALT 17 16  ALKPHOS 111 112  BILITOT <0.2* 0.4  PROT 7.5 7.1  ALBUMIN 3.7 3.4*   No results found for this basename: LIPASE, AMYLASE,  in the last 168 hours No  results found for this basename: AMMONIA,  in the last 168 hours CBC:  Recent Labs Lab 08/12/13 1610 08/13/13 0520  WBC 8.8 9.6  NEUTROABS 5.4  --   HGB 12.2 11.6*  HCT 38.4 36.3  MCV 70.1* 70.1*  PLT 433* 350   Cardiac Enzymes: No results found for this basename: CKTOTAL, CKMB, CKMBINDEX, TROPONINI,  in the last 168 hours BNP (last 3 results) No results found for this basename: PROBNP,  in the last 8760 hours CBG:  Recent Labs Lab 08/14/13 0730 08/14/13 1601 08/14/13 2344 08/15/13 0812 08/15/13 1558  GLUCAP 140* 227* 128* 277* 183*    Recent Results (from the past 240 hour(s))  URINE CULTURE     Status: None   Collection Time    08/13/13  5:50 AM      Result Value Ref Range Status   Specimen Description URINE, CLEAN CATCH   Final   Special Requests Normal   Final   Culture  Setup Time     Final   Value: 08/13/2013 08:19     Performed at [facility] Count     Final   Value: NO GROWTH     Performed at [facility]   Culture     Final   Value: NO GROWTH     Performed at [facility]   Report Status 08/14/2013  FINAL   Final     Studies: No results found.  Scheduled Meds: . docusate sodium  100 mg Oral BID  . methylPREDNISolone (SOLU-MEDROL) injection  500 mg Intravenous BID  . pantoprazole  40 mg Oral Q1200  . sodium chloride  3 mL Intravenous Q12H   Continuous Infusions:    Time spent: > 35 minutes    Anita [name], Anita [name]  [facility] Pager [phone]. If 7PM-7AM, please contact night-coverage at [email or web address], password [email or web address] 08/15/2013, 4:56 PM  LOS: 3 days

## 2013-08-16 LAB — GLUCOSE, CAPILLARY
GLUCOSE-CAPILLARY: 132 mg/dL — AB (ref 70–99)
Glucose-Capillary: 145 mg/dL — ABNORMAL HIGH (ref 70–99)
Glucose-Capillary: 174 mg/dL — ABNORMAL HIGH (ref 70–99)
Glucose-Capillary: 139 mg/dL — ABNORMAL HIGH (ref 70–99)

## 2013-08-16 NOTE — Progress Notes (Signed)
Subjective: States her vision has improved but still not baseline. She has pain behind her eyes that changes daily.  Yesterday it was behind the left eye and today behind the right eye.  Right eye has some blurred vision.  Objective: Current vital signs: BP 143/77  Pulse 60  Temp(Src) 98.4 F (36.9 C) (Oral)  Resp 18  Ht 5\' 5"  (1.651 m)  Wt 108.636 kg (239 lb 8 oz)  BMI 39.85 kg/m2  SpO2 100%  Breastfeeding? No Vital signs in last 24 hours: Temp:  [98.4 F (36.9 C)-99.2 F (37.3 C)] 98.4 F (36.9 C) (06/29 0655) Pulse Rate:  [60-68] 60 (06/29 0655) Resp:  [16-18] 18 (06/29 0655) BP: (127-143)/(77-91) 143/77 mmHg (06/29 0655) SpO2:  [99 %-100 %] 100 % (06/29 0655)  Intake/Output from previous day:   Intake/Output this shift:   Nutritional status: General  Neurologic Exam: Mental Status: Alert, oriented, thought content appropriate.  Speech fluent without evidence of aphasia.  Able to follow 3 step commands without difficulty. Cranial Nerves: II:  Visual fields grossly normal, pupils equal, round, reactive to light and accommodation III,IV, VI: ptosis  Present left eye, extra-ocular motions intact bilaterally V,VII: smile symmetric with slight right NL fold decrease, facial light touch sensation normal bilaterally VIII: hearing normal bilaterally IX,X: gag reflex present XI: bilateral shoulder shrug XII: midline tongue extension without atrophy or fasciculations  Motor: Right : Upper extremity   5/5    Left:     Upper extremity   5/5  Lower extremity   5/5     Lower extremity   5/5 Tone and bulk:normal tone throughout; no atrophy noted Sensory: Pinprick and light touch intact throughout, bilaterally Deep Tendon Reflexes:  Right: Upper Extremity   Left: Upper extremity   biceps (C-5 to C-6) 2/4   biceps (C-5 to C-6) 2/4 tricep (C7) 2/4    triceps (C7) 2/4 Brachioradialis (C6) 2/4  Brachioradialis (C6) 2/4  Lower Extremity Lower Extremity  quadriceps (L-2 to L-4)  2/4   quadriceps (L-2 to L-4) 2/4 Achilles (S1) 2/4   Achilles (S1) 2/4  Plantars: Right: downgoing   Left: downgoing Cerebellar: normal finger-to-nose,  normal heel-to-shin test    Lab Results: Basic Metabolic Panel:  Recent Labs Lab 08/12/13 1610 08/13/13 0520  NA 141 138  K 4.1 4.1  CL 105 104  CO2 22 21  GLUCOSE 86 122*  BUN 15 13  CREATININE 0.85 0.68  CALCIUM 9.4 9.2  MG  --  1.9  PHOS  --  2.7    Liver Function Tests:  Recent Labs Lab 08/12/13 1610 08/13/13 0520  AST 17 15  ALT 17 16  ALKPHOS 111 112  BILITOT <0.2* 0.4  PROT 7.5 7.1  ALBUMIN 3.7 3.4*   No results found for this basename: LIPASE, AMYLASE,  in the last 168 hours No results found for this basename: AMMONIA,  in the last 168 hours  CBC:  Recent Labs Lab 08/12/13 1610 08/13/13 0520  WBC 8.8 9.6  NEUTROABS 5.4  --   HGB 12.2 11.6*  HCT 38.4 36.3  MCV 70.1* 70.1*  PLT 433* 350    Cardiac Enzymes: No results found for this basename: CKTOTAL, CKMB, CKMBINDEX, TROPONINI,  in the last 168 hours  Lipid Panel: No results found for this basename: CHOL, TRIG, HDL, CHOLHDL, VLDL, LDLCALC,  in the last 168 hours  CBG:  Recent Labs Lab 08/14/13 2344 08/15/13 0812 08/15/13 1558 08/16/13 0003 08/16/13 0751  GLUCAP 128* 277* 183* 132*  145*    Microbiology: Results for orders placed during the hospital encounter of 08/12/13  URINE CULTURE     Status: None   Collection Time    08/13/13  5:50 AM      Result Value Ref Range Status   Specimen Description URINE, CLEAN CATCH   Final   Special Requests Normal   Final   Culture  Setup Time     Final   Value: 08/13/2013 08:19     Performed at [facility] Count     Final   Value: NO GROWTH     Performed at [facility]   Culture     Final   Value: NO GROWTH     Performed at [facility]   Report Status 08/14/2013 FINAL   Final    Coagulation Studies: No results found for this basename: LABPROT,  INR,  in the last 72 hours  Imaging: No results found.  Medications:  Scheduled: . docusate sodium  100 mg Oral BID  . methylPREDNISolone (SOLU-MEDROL) injection  500 mg Intravenous BID  . pantoprazole  40 mg Oral Q1200  . sodium chloride  3 mL Intravenous Q12H    Assessment/Plan:  28 year old female with likely multiple sclerosis given multiple recurrent episodes of optic neuritis. Today will be day 4 of solumedrol. Will continue to follow while in hospital.  She will need to have Neurology follow up in 2 weeks after discharge.   Radiology obtained opening pressure but specimen was not sent to lab.  At this time no need to re tap  Patient.    Will continue to follow patient.   Felicie Morn PA-C [facility] [phone]  08/16/2013, 9:20 AM

## 2013-08-16 NOTE — Progress Notes (Signed)
[facility] PROGRESS NOTE  Anita [name] ONG:[id number] DOB: 11/15/1985 DOA: 08/12/2013 PCP: No primary provider on file.  Assessment/Plan: Active Problems:   MS (multiple sclerosis)/Optic neuritis due to multiple sclerosis - Will f/u with Neurology's recommendations outlined in their consult note. - Assess blood glucose levels within the next 24 hours to help with discharge planning.  UTI - urine culture - Continue Rocephin  Vaginal itching and discharge - in context of recent antibiotic use. - Suspect vaginal candidiasis.   - s/p 1 time dose of diflucan.  Code Status: full Family Communication: none at bedside Disposition Plan: Pending blood sugar levels. May go home with home health and high dose steroid dose   Consultants:  Neurology  Procedures:  none  Antibiotics:  none   Antifungal - Diflucan  HPI/Subjective: No new complaints. No acute issues reported overnight.  Objective: Filed Vitals:   08/16/13 0900  BP: 121/77  Pulse: 68  Temp: 98.5 F (36.9 C)  Resp: 17   No intake or output data in the 24 hours ending 08/16/13 1302 Filed Weights   08/12/13 1532 08/13/13 0132  Weight: 103.874 kg (229 lb) 108.636 kg (239 lb 8 oz)    Exam:   General:  Pt in NAD, alert and awake  Cardiovascular: RRR, no MRG  Respiratory: CTA BL, no wheezes  Abdomen: soft, NT, nD  Musculoskeletal: no cyanosis or clubbing   Data Reviewed: Basic Metabolic Panel:  Recent Labs Lab 08/12/13 1610 08/13/13 0520  NA 141 138  K 4.1 4.1  CL 105 104  CO2 22 21  GLUCOSE 86 122*  BUN 15 13  CREATININE 0.85 0.68  CALCIUM 9.4 9.2  MG  --  1.9  PHOS  --  2.7   Liver Function Tests:  Recent Labs Lab 08/12/13 1610 08/13/13 0520  AST 17 15  ALT 17 16  ALKPHOS 111 112  BILITOT <0.2* 0.4  PROT 7.5 7.1  ALBUMIN 3.7 3.4*   No results found for this basename: LIPASE, AMYLASE,  in the last 168 hours No results found for this basename: AMMONIA,  in the last 168  hours CBC:  Recent Labs Lab 08/12/13 1610 08/13/13 0520  WBC 8.8 9.6  NEUTROABS 5.4  --   HGB 12.2 11.6*  HCT 38.4 36.3  MCV 70.1* 70.1*  PLT 433* 350   Cardiac Enzymes: No results found for this basename: CKTOTAL, CKMB, CKMBINDEX, TROPONINI,  in the last 168 hours BNP (last 3 results) No results found for this basename: PROBNP,  in the last 8760 hours CBG:  Recent Labs Lab 08/14/13 2344 08/15/13 0812 08/15/13 1558 08/16/13 0003 08/16/13 0751  GLUCAP 128* 277* 183* 132* 145*    Recent Results (from the past 240 hour(s))  URINE CULTURE     Status: None   Collection Time    08/13/13  5:50 AM      Result Value Ref Range Status   Specimen Description URINE, CLEAN CATCH   Final   Special Requests Normal   Final   Culture  Setup Time     Final   Value: 08/13/2013 08:19     [facility]   [place] Count     Final   Value: NO GROWTH     [facility]   Culture     Final   Value: NO GROWTH     [facility]   Report Status 08/14/2013 FINAL   Final  Studies: No results found.  Scheduled Meds: . docusate sodium  100 mg Oral BID  . methylPREDNISolone (SOLU-MEDROL) injection  500 mg Intravenous BID  . pantoprazole  40 mg Oral Q1200  . sodium chloride  3 mL Intravenous Q12H   Continuous Infusions:    Time spent: > 35 minutes    Penny Pia  [facility] Pager [phone]. If 7PM-7AM, please contact night-coverage at [email or web address], password [facility] 08/16/2013, 1:02 PM  LOS: 4 days

## 2013-08-17 LAB — GLUCOSE, CAPILLARY: Glucose-Capillary: 148 mg/dL — ABNORMAL HIGH (ref 70–99)

## 2013-08-17 MED ORDER — NAPROXEN 500 MG PO TABS: 500.0000 mg | ORAL_TABLET | Freq: Two times a day (BID) | ORAL | Status: DC

## 2013-08-17 NOTE — Discharge Summary (Signed)
Physician Discharge Summary  Anita [name] WUJ:[id number] DOB: 06/17/1985 DOA: 08/12/2013  PCP: No primary provider on file.  Admit date: 08/12/2013 Discharge date: 08/17/2013  Time spent: > 35 minutes  Recommendations for Outpatient Follow-up:  1. Pt to f/u with Neurologist in 2 wks after discharge.  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 Currently finished 6 courses solumedrol 500 mg With continued improvement in her vision. She will need to have Neurology follow up in 2 weeks after discharge.      UTI (urinary tract infection) - Pt completed 3 day course of antibiotic    Vaginal itching - x 1 time dose of diflucan administered.  Procedures:  None  Consultations:  Neurology  Discharge Exam: Filed Vitals:   08/17/13 1511  BP: 140/72  Pulse: 66  Temp: 98.5 F (36.9 C)  Resp: 20    General: Pt in NAD, alert and awake Cardiovascular: RRR, no mrg Respiratory: CTA BL, no wheezes  Discharge Instructions You were cared for by a hospitalist during your hospital stay. If you have any questions about your discharge medications or the care you received while you were in the hospital after you are discharged, you can call the unit and asked to speak  with the hospitalist on call if the hospitalist that took care of you is not available. Once you are discharged, your primary care physician will handle any further medical issues. Please note that NO REFILLS for any discharge medications will be authorized once you are discharged, as it is imperative that you return to your primary care physician (or establish a relationship with a primary care physician if you do not have one) for your aftercare needs so that they can reassess your need for medications and monitor your lab values.  Discharge Instructions   Call MD for:  persistant nausea and vomiting    Complete by:  As directed      Call MD for:  severe uncontrolled pain    Complete by:  As directed      Call MD for:  temperature >100.4    Complete by:  As directed      Diet - low sodium heart healthy    Complete by:  As directed      Increase activity slowly    Complete by:  As directed             Medication List         acetaminophen 500 MG tablet  Commonly known as:  TYLENOL  Take 1,000 mg by mouth every 6 (six) hours as needed for mild pain.     aspirin 325 MG tablet  Take 325 mg by mouth every 4 (four) hours as needed for moderate pain or headache.     naproxen 500  MG tablet  Commonly known as:  NAPROSYN  Take 1 tablet (500 mg total) by mouth 2 (two) times daily with a meal.     prenatal multivitamin Tabs tablet  Take 1 tablet by mouth daily at 12 noon.       Allergies  Allergen Reactions  . Amoxicillin Other (See Comments)    Childhood reaction, doesn't remember      The results of significant diagnostics from this hospitalization (including imaging, microbiology, ancillary and laboratory) are listed below for reference.    Significant Diagnostic Studies: Ct Head Wo Contrast  08/12/2013   CLINICAL DATA:  Poor vision.  Headache.  EXAM: CT HEAD WITHOUT CONTRAST  TECHNIQUE: Contiguous axial images were obtained from the base of the skull through the vertex  without intravenous contrast.  COMPARISON:  CT 02/10/2012.  FINDINGS: No mass. No hydrocephalus. No hemorrhage. Visualized orbits and optic globes are unremarkable. No acute bony abnormality identified.  IMPRESSION: No acute abnormality.   Electronically Signed   By: Maisie Fus  Register   On: 08/12/2013 15:48   Mr Brain W Wo Contrast  08/12/2013   CLINICAL DATA:  BLURRED VISION DENTAL PAIN  EXAM: MRI HEAD WITHOUT AND WITH CONTRAST  TECHNIQUE: Multiplanar, multiecho pulse sequences of the brain and surrounding structures were obtained without and with intravenous contrast.  CONTRAST:  20mL MULTIHANCE GADOBENATE DIMEGLUMINE 529 MG/ML IV SOLN  COMPARISON:  Prior CT performed earlier on the same day.  FINDINGS: There is mild cerebral atrophy for patient age.  Multiple T2/FLAIR hyperintense foci are seen involving the periventricular and deep white matter of both cerebral hemispheres. Many of these are oriented perpendicular to the lateral ventricles and demonstrate hypo intense precontrast T1 signal intensity. A few scattered subcentimeter foci seen within the subcortical left occipital lobe (series 7, image 11). Findings are most consistent with provided history of multiple sclerosis. No infratentorial lesions. No enhancing lesions or abnormal restricted diffusion to suggest active demyelination identified. Optic nerves are symmetric without abnormal enhancement.  No mass lesion, midline shift, or extra-axial fluid collection. Ventricles are normal in size without evidence of hydrocephalus.  No diffusion-weighted signal abnormality is identified to suggest acute intracranial infarct. Gray-white matter differentiation is maintained. Normal flow voids are seen within the intracranial vasculature. No intracranial hemorrhage identified.  The cervicomedullary junction is normal. Pituitary gland is within normal limits. Pituitary stalk is midline. The globes and optic nerves demonstrate a normal appearance with normal signal  intensity.  The bone marrow signal intensity is normal. Calvarium is intact. Visualized upper cervical spine is within normal limits.  Scalp soft tissues are unremarkable.  Mild polypoid mucosal thickening present within the right maxillary and sphenoid sinuses. The paranasal sinuses are otherwise clear. No mastoid effusion.  IMPRESSION: 1. Multiple T2/FLAIR hyperintense lesions involving the supratentorial white white matter, with overall distribution compatible with provided history of multiple sclerosis. No enhancing lesion or abnormal restricted diffusion to suggest acute MS flare. 2. No other acute intracranial process identified. 3. Mild right maxillary and right sphenoid sinus disease.   Electronically Signed   By: Rise Mu M.D.   On: 08/12/2013 22:24   Dg Lumbar Puncture Fluoro Guide  08/12/2013   CLINICAL DATA:  Patient with eye pain and blurred vision. Concern for pseudotumor cerebral. Normal opening pressure on lumbar puncture of 02/10/2012  EXAM: DIAGNOSTIC LUMBAR PUNCTURE UNDER FLUOROSCOPIC GUIDANCE  FLUOROSCOPY TIME:  1 min 31 seconds  PROCEDURE: Informed consent was obtained from the patient prior to the procedure, including potential complications  of headache, allergy, and pain. With the patient prone, the lower back was prepped with Betadine. 1% Lidocaine was used for local anesthesia. Lumbar puncture was performed at the L3-L4 level using a 22 gauge needle with return of clear CSF with an opening pressure of 12 cm water. Twoml of CSF were obtained for laboratory studies. The patient tolerated the procedure well and there were no apparent complications.  IMPRESSION: Slow flow of CSF fluid. Normal opening pressure. Findings are a inconsistent with pseudotumor cerebri.  Findings conveyed toSCOTT GOLDSTON on 08/12/2013  at18:40.   Electronically Signed   By: Genevive Bi M.D.   On: 08/12/2013 18:45    Microbiology: Recent Results (from the past 240 hour(s))  URINE CULTURE      Status: None   Collection Time    08/13/13  5:50 AM      Result Value Ref Range Status   Specimen Description URINE, CLEAN CATCH   Final   Special Requests Normal   Final   Culture  Setup Time     Final   Value: 08/13/2013 08:19     Performed at [facility] Count     Final   Value: NO GROWTH     Performed at [facility]   Culture     Final   Value: NO GROWTH     Performed at [facility]   Report Status 08/14/2013 FINAL   Final     Labs: Basic Metabolic Panel:  Recent Labs Lab 08/12/13 1610 08/13/13 0520  NA 141 138  K 4.1 4.1  CL 105 104  CO2 22 21  GLUCOSE 86 122*  BUN 15 13  CREATININE 0.85 0.68  CALCIUM 9.4 9.2  MG  --  1.9  PHOS  --  2.7   Liver Function Tests:  Recent Labs Lab 08/12/13 1610 08/13/13 0520  AST 17 15  ALT 17 16  ALKPHOS 111 112  BILITOT <0.2* 0.4  PROT 7.5 7.1  ALBUMIN 3.7 3.4*   No results found for this basename: LIPASE, AMYLASE,  in the last 168 hours No results found for this basename: AMMONIA,  in the last 168 hours CBC:  Recent Labs Lab 08/12/13 1610 08/13/13 0520  WBC 8.8 9.6  NEUTROABS 5.4  --   HGB 12.2 11.6*  HCT 38.4 36.3  MCV 70.1* 70.1*  PLT 433* 350   Cardiac Enzymes: No results found for this basename: CKTOTAL, CKMB, CKMBINDEX, TROPONINI,  in the last 168 hours BNP: BNP (last 3 results) No results found for this basename: PROBNP,  in the last 8760 hours CBG:  Recent Labs Lab 08/16/13 0003 08/16/13 0751 08/16/13 1608 08/16/13 2310 08/17/13 0756  GLUCAP 132* 145* 174* 139* 148*       Signed:  Penny Pia  [facility] 08/17/2013, 4:01 PM

## 2013-08-17 NOTE — Progress Notes (Signed)
Subjective: States vision is slowly improving. Had a hard time sleeping last night. Left eye continues to have intermittent blepharospasm but less than yesterday.  Objective: Current vital signs: BP 126/82  Pulse 43  Temp(Src) 98.3 F (36.8 C) (Oral)  Resp 16  Ht 5\' 5"  (1.651 m)  Wt 108.636 kg (239 lb 8 oz)  BMI 39.85 kg/m2  SpO2 100%  Breastfeeding? No Vital signs in last 24 hours: Temp:  [98.3 F (36.8 C)-98.6 F (37 C)] 98.3 F (36.8 C) (06/30 0648) Pulse Rate:  [43-60] 43 (06/30 0648) Resp:  [16-20] 16 (06/30 0648) BP: (108-146)/(62-82) 126/82 mmHg (06/30 0648) SpO2:  [98 %-100 %] 100 % (06/30 0648)  Intake/Output from previous day: 06/29 0701 - 06/30 0700 In: 348 [P.O.:240; IV Piggyback:108] Out: -  Intake/Output this shift:   Nutritional status: General  Neurologic Exam: Mental Status:  Alert, oriented, thought content appropriate. Speech fluent without evidence of aphasia. Able to follow 3 step commands without difficulty.  Cranial Nerves:  II: Visual fields grossly normal, pupils equal, round, reactive to light and accommodation  III,IV, VI: ptosis Present left eye, extra-ocular motions intact bilaterally  V,VII: smile symmetric with slight right NL fold decrease, facial light touch sensation normal bilaterally  VIII: hearing normal bilaterally  IX,X: gag reflex present  XI: bilateral shoulder shrug  XII: midline tongue extension without atrophy or fasciculations  Motor:  5/5 throughout  Sensory: Pinprick and light touch intact throughout, bilaterally  Deep Tendon Reflexes:  2+ throughout Plantars:  Right: downgoing  Left: downgoing  Cerebellar:  normal finger-to-nose, normal heel-to-shin test      Lab Results: Basic Metabolic Panel:  Recent Labs Lab 08/12/13 1610 08/13/13 0520  NA 141 138  K 4.1 4.1  CL 105 104  CO2 22 21  GLUCOSE 86 122*  BUN 15 13  CREATININE 0.85 0.68  CALCIUM 9.4 9.2  MG  --  1.9  PHOS  --  2.7    Liver Function  Tests:  Recent Labs Lab 08/12/13 1610 08/13/13 0520  AST 17 15  ALT 17 16  ALKPHOS 111 112  BILITOT <0.2* 0.4  PROT 7.5 7.1  ALBUMIN 3.7 3.4*   No results found for this basename: LIPASE, AMYLASE,  in the last 168 hours No results found for this basename: AMMONIA,  in the last 168 hours  CBC:  Recent Labs Lab 08/12/13 1610 08/13/13 0520  WBC 8.8 9.6  NEUTROABS 5.4  --   HGB 12.2 11.6*  HCT 38.4 36.3  MCV 70.1* 70.1*  PLT 433* 350    Cardiac Enzymes: No results found for this basename: CKTOTAL, CKMB, CKMBINDEX, TROPONINI,  in the last 168 hours  Lipid Panel: No results found for this basename: CHOL, TRIG, HDL, CHOLHDL, VLDL, LDLCALC,  in the last 168 hours  CBG:  Recent Labs Lab 08/16/13 0003 08/16/13 0751 08/16/13 1608 08/16/13 2310 08/17/13 0756  GLUCAP 132* 145* 174* 139* 148*    Microbiology: Results for orders placed during the hospital encounter of 08/12/13  URINE CULTURE     Status: None   Collection Time    08/13/13  5:50 AM      Result Value Ref Range Status   Specimen Description URINE, CLEAN CATCH   Final   Special Requests Normal   Final   Culture  Setup Time     Final   Value: 08/13/2013 08:19     [facility]formed at [facility]   [place] Count     Final  Value: NO GROWTH     Performed at [facility]     Final   Value: NO GROWTH     Performed at [facility]   Report Status 08/14/2013 FINAL   Final    Coagulation Studies: No results found for this basename: LABPROT, INR,  in the last 72 hours  Imaging: No results found.  Medications:  Scheduled: . docusate sodium  100 mg Oral BID  . methylPREDNISolone (SOLU-MEDROL) injection  500 mg Intravenous BID  . pantoprazole  40 mg Oral Q1200  . sodium chloride  3 mL Intravenous Q12H    Assessment/Plan:  28 year old female with likely multiple sclerosis given multiple recurrent episodes of optic neuritis. Currently finished 6 courses solumedrol 500 mg   With continued improvement in her vision.  She will need to have Neurology follow up in 2 weeks after discharge.  Discussed with Dr. Cena Benton. Neurology will S/O    Felicie Morn PA-C [facility] [phone]  08/17/2013, 9:37 AM

## 2013-08-18 LAB — NEUROMYELITIS OPTICA AUTOAB, IGG: NMO-IgG: NEGATIVE

## 2013-08-19 NOTE — ED Provider Notes (Signed)
Medical screening examination/treatment/procedure(s) were conducted as a shared visit with non-physician practitioner(s) and myself.  I personally evaluated the patient during the encounter.   EKG Interpretation None       Patient with recurrent headache and one sided visual changes. She relates this is to hx of pseudotumor, though relates only 1 tap in past. Unable to get LP myself, referred to radiology. Normal OP there, thus MRI obtained and patient has MS. Appears to be this is coming from optic neuritis. Admit with IV steroids.  Audree Camel, MD 08/19/13 1113

## 2013-08-27 ENCOUNTER — Encounter: Payer: Self-pay | Admitting: Cardiology

## 2013-08-27 ENCOUNTER — Encounter: Payer: Self-pay | Admitting: Neurology

## 2013-08-31 NOTE — Telephone Encounter (Signed)
We have not seen this patient in our clinic in the past.

## 2013-10-18 ENCOUNTER — Ambulatory Visit: Payer: Medicaid Other | Admitting: Neurology

## 2013-10-21 ENCOUNTER — Ambulatory Visit (INDEPENDENT_AMBULATORY_CARE_PROVIDER_SITE_OTHER): Payer: Medicaid Other

## 2013-10-21 ENCOUNTER — Ambulatory Visit: Payer: Medicaid Other | Admitting: Neurology

## 2013-10-21 ENCOUNTER — Ambulatory Visit (INDEPENDENT_AMBULATORY_CARE_PROVIDER_SITE_OTHER): Payer: Medicaid Other | Admitting: Neurology

## 2013-10-21 ENCOUNTER — Encounter: Payer: Self-pay | Admitting: Neurology

## 2013-10-21 VITALS — BP 127/90 | HR 71 | Ht 64.5 in | Wt 249.0 lb

## 2013-10-21 DIAGNOSIS — G35 Multiple sclerosis: Secondary | ICD-10-CM

## 2013-10-21 DIAGNOSIS — H469 Unspecified optic neuritis: Secondary | ICD-10-CM

## 2013-10-21 NOTE — Procedures (Signed)
    History:   Anita [name] is a 28 year old patient with a history of onset of right eye blurred vision that began in June 2015, associated with a washout of color perception. The patient had some visual disturbances as well in 2003. The patient is being evaluated for possible demyelinating disease.  Description: The visual evoked response test was performed today using 32 x 32 check sizes. The absolute latencies for the N1 and the P100 wave forms were within normal limits on the left, with the N1 latency on the right being at the upper limits of normal, with prolongation of the P100 latency on the right.. The amplitudes for the P100 wave forms were also within normal limits bilaterally. The visual acuity was 20/70 OD and 20/70 OS uncorrected.  Impression:  The visual evoked response test above was within normal limits on the left, with prolongation of the P100 latency on the right. This study is consistent with slowing of conduction within the anterior visual pathway on the right, and may be consistent with a demyelinating lesion. Clinical correlation is required.

## 2013-10-21 NOTE — Patient Instructions (Signed)
  Plegridy (peginterferon beta-1a)  Injection twice a month Rebif (interferon beta-1a), injection, 3 /week    Gilenya (fingolimod)   Tablet, once a day Tecfidera (dimethyl fumarate), tablet twice a day

## 2013-10-21 NOTE — Progress Notes (Addendum)
PATIENT: Anita [name] DOB: [birth year]  HISTORICAL  Anita [name] is a 28 years old right-handed African American female, referred by her primary care physician Dr.Reese for evaluation of multiple sclerosis  She presented with right side blurry vision, color washout in June 2015, was admitted to the hospital August 13 2013, lumbar puncture showed  open pressure of 12 cm water, but no CSF study was done,  MRI of the brain has demonstrated multiple supratentorial lesions, oval shaped, periventricular, no contrast enhancement, consistent with a diagnosis of relapsing remitting multiple sclerosis, she was treated with IV Solu-Medrol for 5 consecutive days, vision has much improved, she still has right eye vision color washout, mild blurry  Initial episode 2003, bilateral blurry vision, right worse than left, improved with IV steroids, she was diagnosed with probable multiple sclerosis at that time, but lost followup  Second episode 2012, right eye blurry vision, color washout, again improved by by mouth steroids  She now denies gait difficulty, she has 32-month-old son, she is no longer breast-feeding,  Laboratory showed normal CMP, mild anemia hemoglobin of 11.6, normal TSH, nmo antibody.  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 Amoxicillin Other (See Comments)    Childhood reaction, doesn't remember    HOME MEDICATIONS: Current Outpatient Prescriptions on File Prior to Visit  Medication Sig Dispense Refill  . Prenatal Vit-Fe Fumarate-FA (PRENATAL MULTIVITAMIN) TABS Take 1 tablet by mouth daily at 12 noon.       No current facility-administered medications on file prior to visit.    PAST MEDICAL HISTORY: Past Medical History  Diagnosis Date  . GERD (gastroesophageal reflux disease)   . MS (multiple sclerosis)   .  Trichomonas   . UTI (lower urinary tract infection)     PAST SURGICAL HISTORY: Past Surgical History  Procedure Laterality Date  . Tonsillectomy    . Adenoidectomy      FAMILY HISTORY: Family History  Problem Relation Age of Onset  . Heart disease      No family history  . Diabetes type II Mother     SOCIAL HISTORY:  History   Social History  . Marital Status: Single    Spouse Name: N/A    Number of Children: N/A  . Years of Education: N/A   Occupational History  . Not on file.   Social History Main Topics  . Smoking status: Former Smoker    Quit date: 06/17/2012  . Smokeless tobacco: Never Used  . Alcohol Use: No     Comment: occ  . Drug Use: No     Comment: used a few days ago.  Marland Kitchen Sexual Activity: Yes    Birth Control/ Protection: Implant   Other Topics Concern  . Not on file   Social History Narrative  . No narrative on file     PHYSICAL EXAM   Filed Vitals:   10/21/13 0848  BP: 127/90  Pulse: 71  Height: 5' 4.5" (1.638 m)  Weight: 249 lb (112.946 kg)    Not recorded    Body mass index is 42.1 kg/(m^2).   Generalized: In no acute distress  Neck: Supple, no carotid bruits   Cardiac: Regular rate rhythm  Pulmonary: Clear to auscultation bilaterally  Musculoskeletal: No deformity  Neurological examination  Mentation: Alert oriented to time, place, history taking, and causual  conversation, she was able to read fine print with her right eye  Cranial nerve II-XII: Pupils were equal round reactive to light. Extraocular movements were full.  Visual field were full on confrontational test. Bilateral fundi were sharp.  She has right afferent pupillary defect. Facial sensation and strength were normal. Hearing was intact to finger rubbing bilaterally. Uvula tongue midline.  Head turning and shoulder shrug and were normal and symmetric.Tongue protrusion into cheek strength was normal.  Motor: Normal tone, bulk and strength.  Sensory: Intact  to fine touch, pinprick, preserved vibratory sensation, and proprioception at toes.  Coordination: Normal finger to nose, heel-to-shin bilaterally there was no truncal ataxia  Gait: Rising up from seated position without assistance, normal stance, without trunk ataxia, moderate stride, good arm swing, smooth turning, able to perform tiptoe, and heel walking without difficulty.   Romberg signs: Negative  Deep tendon reflexes: Brachioradialis 2/2, biceps 2/2, triceps 2/2, patellar 2/2, Achilles 2/2, plantar responses were flexor bilaterally.   DIAGNOSTIC DATA (LABS, IMAGING, TESTING) - I reviewed patient records, labs, notes, testing and imaging myself where available.  Lab Results  Component Value Date   WBC 9.6 08/13/2013   HGB 11.6* 08/13/2013   HCT 36.3 08/13/2013   MCV 70.1* 08/13/2013   PLT 350 08/13/2013      Component Value Date/Time   NA 138 08/13/2013 0520   K 4.1 08/13/2013 0520   CL 104 08/13/2013 0520   CO2 21 08/13/2013 0520   GLUCOSE 122* 08/13/2013 0520   BUN 13 08/13/2013 0520   CREATININE 0.68 08/13/2013 0520   CALCIUM 9.2 08/13/2013 0520   PROT 7.1 08/13/2013 0520   ALBUMIN 3.4* 08/13/2013 0520   AST 15 08/13/2013 0520   ALT 16 08/13/2013 0520   ALKPHOS 112 08/13/2013 0520   BILITOT 0.4 08/13/2013 0520   GFRNONAA >90 08/13/2013 0520   GFRAA >90 08/13/2013 0520    Lab Results  Component Value Date   TSH 0.948 08/13/2013    ASSESSMENT AND PLAN  Anita [name] is a 28 y.o. female with recurrent right optic neuritis, MRI and findings consistent with relapsing remitting multiple sclerosis  1. Complete evaluation with MRI of cervical, thoracic spine with and without contrast  2. Visual evoked potential  3. Laboratory to rule out mimics  4. Treatment option was given to the patient, including Rebif, Plegridy, Gilenya, Tecfidera 5.  return to clinic in one month   Levert Feinstein, M.D. Ph.D.  [facility] [address], [address] [place], [place]  [address] [phone]

## 2013-10-22 ENCOUNTER — Telehealth: Payer: Self-pay | Admitting: Neurology

## 2013-10-22 LAB — LYME, TOTAL AB TEST/REFLEX: Lyme IgG/IgM Ab: <0.91 {ISR} (ref 0.00–0.90)

## 2013-10-22 LAB — VITAMIN B12: VITAMIN B 12: 295 pg/mL (ref 211–946)

## 2013-10-22 LAB — FOLATE: FOLATE: 11.9 ng/mL (ref >3.0)

## 2013-10-22 LAB — ANA W/REFLEX IF POSITIVE: Anti Nuclear Antibody(ANA): NEGATIVE

## 2013-10-22 LAB — C-REACTIVE PROTEIN: CRP: 10.3 mg/L — ABNORMAL HIGH (ref 0.0–4.9)

## 2013-10-22 LAB — VARICELLA ZOSTER ANTIBODY, IGG: Varicella zoster IgG: 2275 index (ref >165)

## 2013-10-22 LAB — HIV ANTIBODY (ROUTINE TESTING W REFLEX)
HIV 1/O/2 Abs-Index Value: <1 (ref <1.00)
HIV-1/HIV-2 Ab: NONREACTIVE

## 2013-10-26 NOTE — Telephone Encounter (Signed)
I have called Aubriee,left message,  Lupita Leash, please call her again her Vit B12 was low 295, she needs overcounter B12 supplement, qday, mild elevated CRP of unknown clinical significance.

## 2013-10-26 NOTE — Telephone Encounter (Signed)
Left message with lab results, Vit B12 low and to take an over the counter supplement of every day, also mildly elevated CRP of unknown clinical significance, per Dr. Terrace Arabia.  Asked her to call with any further questions.

## 2013-10-27 ENCOUNTER — Other Ambulatory Visit: Payer: Self-pay | Admitting: Neurology

## 2013-10-27 DIAGNOSIS — G35 Multiple sclerosis: Secondary | ICD-10-CM

## 2013-11-19 ENCOUNTER — Ambulatory Visit (INDEPENDENT_AMBULATORY_CARE_PROVIDER_SITE_OTHER): Payer: Medicaid Other | Admitting: Neurology

## 2013-11-19 ENCOUNTER — Encounter: Payer: Self-pay | Admitting: Neurology

## 2013-11-19 VITALS — BP 125/77 | HR 80

## 2013-11-19 DIAGNOSIS — G35 Multiple sclerosis: Secondary | ICD-10-CM

## 2013-11-19 MED ORDER — CITALOPRAM HYDROBROMIDE 10 MG PO TABS: 10.0000 mg | ORAL_TABLET | Freq: Every day | ORAL | Status: DC

## 2013-11-19 NOTE — Progress Notes (Addendum)
PATIENT: Anita [name] DOB: 10/23/1985  HISTORICAL  Anita [name] is a 28 years old right-handed African American female, referred by her primary care physician Dr.Reese for evaluation of multiple sclerosis  She presented with right side blurry vision, color washout in June 2015, was admitted to the hospital, lumbar puncture showed  open pressure of 12 cm water, but no CSF study was done,  MRI of the brain has demonstrated multiple supratentorial lesions, oval shaped, periventricular, no contrast enhancement, consistent with a diagnosis of relapsing remitting multiple sclerosis, she was treated with IV Solu-Medrol for 5 consecutive days, vision has much improved, she still has right eye vision color washout, mild blurry  Initial episode was in 2003, bilateral blurry vision, right worse than left, improved with IV steroids, she was diagnosed with probable multiple sclerosis at that time, but lost followup  Second episode was in 2012, right eye blurry vision, color washout, again improved by by mouth steroids  She now denies gait difficulty, she has 4060-month-old son, she is no longer breast-feeding,  Laboratory showed normal CMP, mild anemia hemoglobin of 11.6, normal TSH, nmo antibody, normal or negative B12, RPR, HIV, folic acid, Lyme titer  JC virus antibody positive, 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  This study is consistent with slowing of conduction within the  anterior visual pathway on the right, and may be consistent with a demyelinating lesion  She now complains of depression, crying sometimes, she stays at home,  She has no gait difficulty.  Her right vision is slight dimmer, with color washout  I have reviewed treatment options with her, she decided to proceed with Rebif, understanding the potential side effect of worsening  depression, thyroid dysfunction, and abnormal liver function tests injection site reaction.  REVIEW OF SYSTEMS: Full 14 system review of systems performed and notable only for headache, insomnia  ALLERGIES: Allergies  Allergen Reactions  . Amoxicillin Other (See Comments)    Childhood reaction, doesn't remember    HOME MEDICATIONS: Current Outpatient Prescriptions on File Prior to Visit  Medication Sig Dispense Refill  . Prenatal Vit-Fe Fumarate-FA (PRENATAL MULTIVITAMIN) TABS Take 1 tablet by mouth daily at 12 noon.       No current facility-administered medications on file prior to visit.    PAST MEDICAL HISTORY: Past Medical History  Diagnosis Date  . GERD (gastroesophageal reflux disease)   . MS (multiple sclerosis)   . Trichomonas   . UTI (lower urinary tract infection)     PAST SURGICAL HISTORY: Past Surgical History  Procedure Laterality Date  . Tonsillectomy    . Adenoidectomy      FAMILY HISTORY: Family History  Problem Relation Age of Onset  . Heart disease      No family history  . Diabetes type II Mother     SOCIAL HISTORY:  History   Social History  . Marital Status: Single    Spouse Name: N/A    Number of Children: N/A  . Years of Education: N/A   Occupational History  . Not on file.   Social History Main Topics  . Smoking status: Former Smoker    Quit date: 06/17/2012  . Smokeless tobacco: Never Used  . Alcohol Use: No     Comment: occ  . Drug Use: No     Comment: used a  few days ago.  Marland Kitchen Sexual Activity: Yes    Birth Control/ Protection: Implant   Other Topics Concern  . Not on file   Social History Narrative  . No narrative on file     PHYSICAL EXAM   Filed Vitals:   11/19/13 0817  BP: 125/77  Pulse: 80    Not recorded    There is no weight on file to calculate BMI.   Generalized: In no acute distress  Neck: Supple, no carotid bruits   Cardiac: Regular rate rhythm  Pulmonary: Clear to auscultation  bilaterally  Musculoskeletal: No deformity  Neurological examination  Mentation: Alert oriented to time, place, history taking, and causual conversation, she was able to read fine print with her right eye  Cranial nerve II-XII: Pupils were equal round reactive to light. Extraocular movements were full.  Visual field were full on confrontational test. Bilateral fundi were sharp.  She has right afferent pupillary defect. Facial sensation and strength were normal. Hearing was intact to finger rubbing bilaterally. Uvula tongue midline.  Head turning and shoulder shrug and were normal and symmetric.Tongue protrusion into cheek strength was normal.  Motor: Normal tone, bulk and strength.  Sensory: Intact to fine touch, pinprick, preserved vibratory sensation, and proprioception at toes.  Coordination: Normal finger to nose, heel-to-shin bilaterally there was no truncal ataxia  Gait: Rising up from seated position without assistance, normal stance, without trunk ataxia, moderate stride, good arm swing, smooth turning, able to perform tiptoe, and heel walking without difficulty.   Romberg signs: Negative  Deep tendon reflexes: Brachioradialis 2/2, biceps 2/2, triceps 2/2, patellar 2/2, Achilles 2/2, plantar responses were flexor bilaterally.   DIAGNOSTIC DATA (LABS, IMAGING, TESTING) - I reviewed patient records, labs, notes, testing and imaging myself where available.  Lab Results  Component Value Date   WBC 9.6 08/13/2013   HGB 11.6* 08/13/2013   HCT 36.3 08/13/2013   MCV 70.1* 08/13/2013   PLT 350 08/13/2013      Component Value Date/Time   NA 138 08/13/2013 0520   K 4.1 08/13/2013 0520   CL 104 08/13/2013 0520   CO2 21 08/13/2013 0520   GLUCOSE 122* 08/13/2013 0520   BUN 13 08/13/2013 0520   CREATININE 0.68 08/13/2013 0520   CALCIUM 9.2 08/13/2013 0520   PROT 7.1 08/13/2013 0520   ALBUMIN 3.4* 08/13/2013 0520   AST 15 08/13/2013 0520   ALT 16 08/13/2013 0520   ALKPHOS 112 08/13/2013 0520    BILITOT 0.4 08/13/2013 0520   GFRNONAA >90 08/13/2013 0520   GFRAA >90 08/13/2013 0520    Lab Results  Component Value Date   TSH 0.948 08/13/2013    ASSESSMENT AND PLAN  Anita [name] is a 28 y.o. female with recurrent right optic neuritis, MRI and findings consistent with relapsing remitting multiple sclerosis, she is JC virus antibody positive, with titer of 3.0.  1. complete evaluation with MRI of cervical, thoracic spine with without contrast as baseline 2. After discussed with patient, she decided to take Rebif, paperwork were complete, 3. Return to clinic in one to 2 months       Levert Feinstein, M.D. Ph.D.  [facility] [address] [place], [place] [address] [phone]

## 2013-11-22 ENCOUNTER — Telehealth: Payer: Self-pay | Admitting: Neurology

## 2013-11-22 MED ORDER — CITALOPRAM HYDROBROMIDE 10 MG PO TABS: 10.0000 mg | ORAL_TABLET | Freq: Every day | ORAL | Status: AC

## 2013-11-22 NOTE — Telephone Encounter (Signed)
Rx has been resent to [facility] per patient request.  I called the patient back, got no answer.  Left message.

## 2013-11-22 NOTE — Telephone Encounter (Signed)
Patient calling to state that she wanted her Celexa script sent to the  [facility] on [address], please return call and advise.

## 2013-12-02 ENCOUNTER — Telehealth: Payer: Self-pay

## 2013-12-08 ENCOUNTER — Telehealth: Payer: Self-pay | Admitting: Neurology

## 2013-12-08 NOTE — Telephone Encounter (Signed)
Patient calling to state that she has been reading about her Celexa medication and saw that it had a high risk of weight gain from taking it and wants to know if she can try Wellbutrin instead, please return call and advise.

## 2013-12-08 NOTE — Telephone Encounter (Signed)
I have called her home, and cell phone, left a message at both  for her to call back

## 2013-12-09 ENCOUNTER — Telehealth: Payer: Self-pay | Admitting: Neurology

## 2013-12-09 MED ORDER — BUPROPION HCL ER (SR) 150 MG PO TB12: 150.0000 mg | ORAL_TABLET | Freq: Every day | ORAL | Status: AC

## 2013-12-09 NOTE — Telephone Encounter (Signed)
I have called her to start wellbutrin xr 150mg  qday.

## 2013-12-09 NOTE — Telephone Encounter (Signed)
Patient returning Dr. Yan's call. °

## 2013-12-09 NOTE — Telephone Encounter (Signed)
Called patient back and left another message to call back.

## 2013-12-15 ENCOUNTER — Ambulatory Visit
Admission: RE | Admit: 2013-12-15 | Discharge: 2013-12-15 | Disposition: A | Discharge status: Home / Self Care | Payer: Medicaid Other | Source: Ambulatory Visit | Attending: Neurology | Admitting: Neurology

## 2013-12-15 ENCOUNTER — Other Ambulatory Visit: Payer: Medicaid Other

## 2013-12-15 ENCOUNTER — Encounter (INDEPENDENT_AMBULATORY_CARE_PROVIDER_SITE_OTHER): Payer: Medicaid Other | Admitting: Diagnostic Neuroimaging

## 2013-12-15 DIAGNOSIS — G35 Multiple sclerosis: Secondary | ICD-10-CM

## 2013-12-15 DIAGNOSIS — H469 Unspecified optic neuritis: Secondary | ICD-10-CM

## 2013-12-15 MED ORDER — GADOBENATE DIMEGLUMINE 529 MG/ML IV SOLN
20.0000 mL | Freq: Once | INTRAVENOUS | Status: AC | PRN
  Administered 2013-12-15: 20 mL via INTRAVENOUS

## 2013-12-17 NOTE — Progress Notes (Signed)
Quick Note:  Correction, it is normal MRI thoracic spine ______

## 2013-12-20 ENCOUNTER — Encounter: Payer: Self-pay | Admitting: Neurology

## 2013-12-20 NOTE — Telephone Encounter (Signed)
Message was just sent to me today.   Last OV note from 10/02 says: 2. After discussed with patient, she decided to take Rebif I called the patient back.  Got no answer.  Left message.

## 2013-12-20 NOTE — Telephone Encounter (Signed)
Patient questioning if she needs to continue taking Rebiff.  Please call and advise.

## 2013-12-20 NOTE — Progress Notes (Signed)
Quick Note:  Called patient normal MRI results. ______

## 2013-12-21 ENCOUNTER — Telehealth: Payer: Self-pay | Admitting: Neurology

## 2013-12-21 NOTE — Progress Notes (Signed)
Quick Note:  I called and gave the results of C spine to pt. She asked about starting REBIF. I relayed that I did not see any notes other then last ofv note and that this she would proceed with the REBIF. Pt is in [place] now with her mother. I would relay to Dr. Terrace Arabia. ______

## 2013-12-21 NOTE — Telephone Encounter (Signed)
-----   Message from Alverda Skeans, [place] sent at 12/21/2013 11:10 AM EST ----- I called and gave the results of C spine to pt.  She asked about starting REBIF.  I relayed that I did not see any notes other then last ofv note and that this  she would proceed with the REBIF.  Pt is in [place] now with her mother.  I would relay to Dr. Terrace Arabia.

## 2013-12-21 NOTE — Telephone Encounter (Signed)
Paper work for [employer] has started since last visit in Oct 2nd 2015.  Jessica: please check on the process of the rebif.

## 2013-12-21 NOTE — Telephone Encounter (Signed)
I called [facility] (Rebif).  Spoke with [employer].  She said they called the patient 3 times and left messages, but she has not returned their call.  Says as well, they mailed the patient a letter asking her to call them back, but she still has not reached out to them.  They show her Rebif was delivered on Oct 16th, and she had a nurse visit on Oct 27.   I called the patient back.  Got no answer. Left message asking that she call us back with any questions she may have.

## 2013-12-24 ENCOUNTER — Telehealth: Payer: Self-pay | Admitting: Neurology

## 2013-12-24 ENCOUNTER — Encounter: Payer: Self-pay | Admitting: Neurology

## 2013-12-24 NOTE — Telephone Encounter (Signed)
Called patient, left message and mailed letter with rescheduled appointment date from 12/2 @ 8 am to 01/24/14 @ 11:00.

## 2014-01-19 ENCOUNTER — Ambulatory Visit: Payer: Medicaid Other | Admitting: Neurology

## 2014-01-24 ENCOUNTER — Ambulatory Visit: Payer: Medicaid Other | Admitting: Neurology

## 2014-01-31 ENCOUNTER — Encounter: Payer: Self-pay | Admitting: Neurology

## 2014-02-07 ENCOUNTER — Telehealth: Payer: Self-pay | Admitting: Neurology

## 2014-02-07 NOTE — Telephone Encounter (Signed)
Patient's having issues with Insomnia since being on Rx Rebif.  Patient also stated she doesn't feel Rx buPROPion (WELLBUTRIN SR) 150 MG 12 hr tablet isn't beneficial.  Please call and advise.

## 2014-02-07 NOTE — Telephone Encounter (Signed)
Patient says she has been experiencing insomnia since starting Rebif.  She did contact the Rebif nurse and they recommended she change her injection time from 10pm to 12 noon.  She has done this, however, still has trouble sleeping.  She would like to know what the provider recommends.  As well, she does not feel Wellbutrin has been beneficial.  Please advise.  Thank you.

## 2014-02-08 MED ORDER — NORTRIPTYLINE HCL 25 MG PO CAPS: ORAL_CAPSULE | ORAL | Status: AC

## 2014-02-08 NOTE — Telephone Encounter (Signed)
I failed to reach patient, let message, I also emailed her today, she has follow up appt in Jan 15th 2016.

## 2014-02-08 NOTE — Telephone Encounter (Signed)
I have called and left message for her to call back.  Shanda Bumps, please call back patient's again, chart reviewed, she supposed to take Celexa 10 mg every day, Wellbutrin  xr 150 mg daily, advised her take those 2 medications in the morning time, I have written nortriptyline 25 mg titrating to 2 tablets every night, hope this help her insomnia  Keep follow-up appointment in January 15

## 2014-02-08 NOTE — Telephone Encounter (Signed)
Pt wants to know if there is something else she can with Wellbutrin besides Celexa.  Please call and advise.

## 2014-02-08 NOTE — Telephone Encounter (Signed)
Patient called the office back and wants to know if something different can be prescribed to take along with Wellbutrin besides Celexa, as she does not wish to take that medication.  Please advise.  Thank you.

## 2014-02-08 NOTE — Telephone Encounter (Signed)
I called back.  Got no answer.  Left message.  

## 2014-03-03 ENCOUNTER — Ambulatory Visit: Payer: Medicaid Other | Admitting: Neurology

## 2014-03-04 ENCOUNTER — Ambulatory Visit: Payer: Medicaid Other | Admitting: Neurology

## 2014-03-10 ENCOUNTER — Telehealth: Payer: Self-pay | Admitting: Neurology

## 2014-03-10 ENCOUNTER — Encounter: Payer: Self-pay | Admitting: Neurology

## 2014-03-10 NOTE — Telephone Encounter (Signed)
Patient is calling because [facility] cannot fill Rebif because the MVR is not recognized. Patient only has 2 injections. Please call and advise. Thank you.

## 2014-03-10 NOTE — Telephone Encounter (Signed)
I called back.  Patient said she was not certain what the pharmacy needed.  She had a number for them of [phone].  I called them.  Spoke with Crystal.  She viewed patient's account and said there is nothing needed from [place], order has been processed and will be delivered on Tuesday.  I called the patient back.  She is aware.

## 2014-03-10 NOTE — Telephone Encounter (Signed)
I called back to clarify, got no answer.  Left message.

## 2014-03-10 NOTE — Telephone Encounter (Signed)
Patient stated Pharmacy not recognizing the MVR code for Dr. Terrace Arabia to show she's the prescribing MD for Rx Rebif.  Please return call @ [phone].

## 2014-03-15 NOTE — Telephone Encounter (Signed)
Anita [name] with [facility] @ [phone], option 4, checking status of fax sent on 03/14/14 requesting refill for Rx Rebif.  Please call and advise.

## 2014-03-15 NOTE — Telephone Encounter (Signed)
They faxed the request for info to [place] last night at 7:47 pm (after hours).  I called back.  They just wanted a copy of patients info, demographics, ins etc to update their files.  All requested info has been faxed back to them at [phone].

## 2014-03-15 NOTE — Telephone Encounter (Signed)
I have spoken with the patient who says she will be getting mer meds through [facility] rather than her mail order pharmacy.  She will call us back if anything further is needed. Says she's still contemplating, but will likely be moving to [place].

## 2014-04-14 DIAGNOSIS — Z0289 Encounter for other administrative examinations: Secondary | ICD-10-CM

## 2014-05-30 ENCOUNTER — Telehealth: Payer: Self-pay | Admitting: Neurology

## 2014-05-30 MED ORDER — INTERFERON BETA-1A 44 MCG/0.5ML ~~LOC~~ SOAJ: 44.0000 ug | SUBCUTANEOUS | Status: AC

## 2014-05-30 NOTE — Telephone Encounter (Signed)
Lowry Ram is calling requesting a new Rx for Rebi dose (pre filled pen) for pt.  You can fax the Rx to [phone]. Please advise.

## 2014-05-30 NOTE — Telephone Encounter (Signed)
Rx has been sent  

## 2014-06-01 IMAGING — US US OB COMP +14 WK
1 series · 12 of 28 positions shown · non-contrast
Comparison: none

[Series 1: us ob comp +14 wk · 73 acquisitions, 12 frames shown]
[im 3/73]
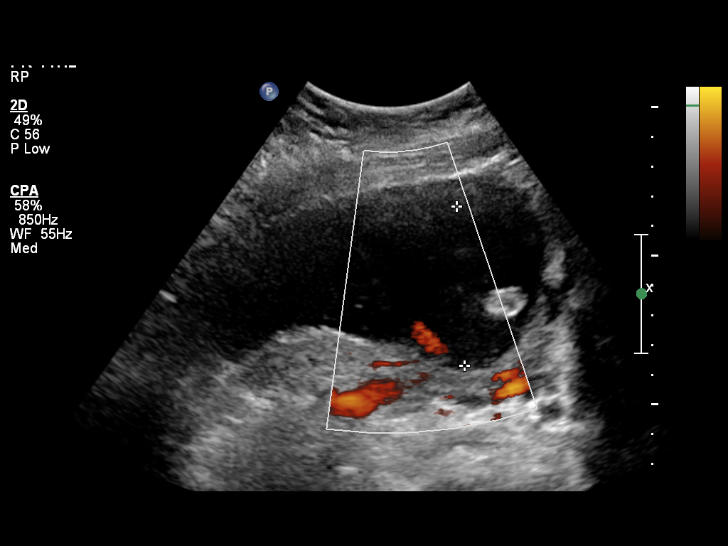
[im 9/73]
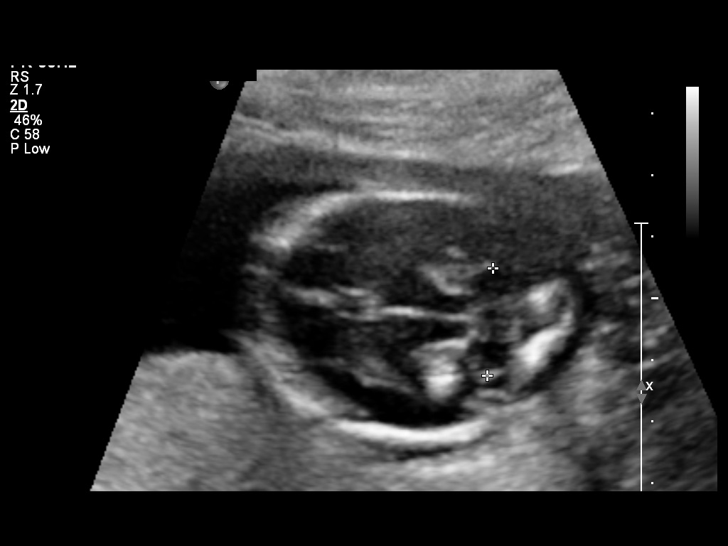
[im 14/73]
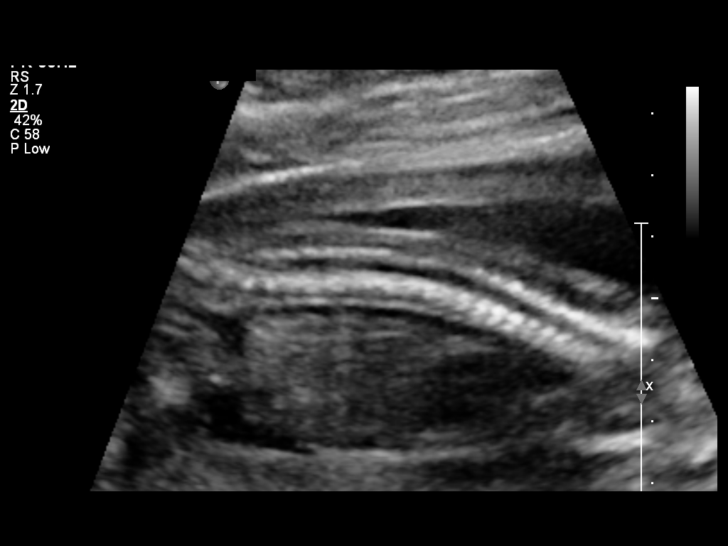
[im 22/73]
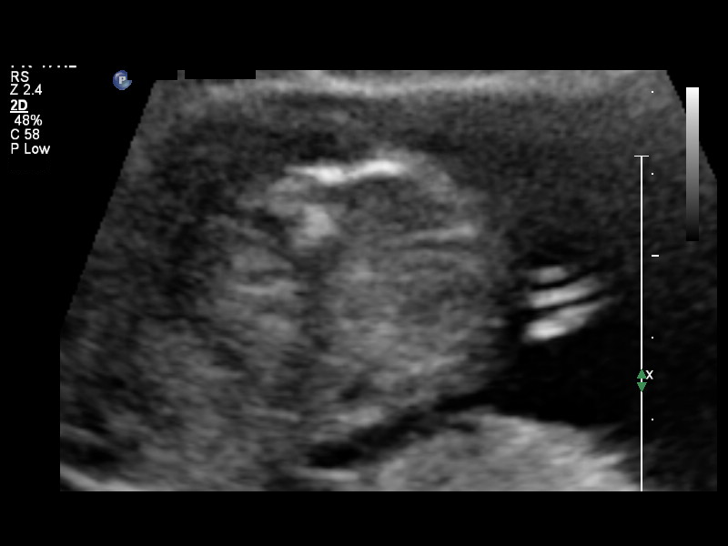
[im 27/73]
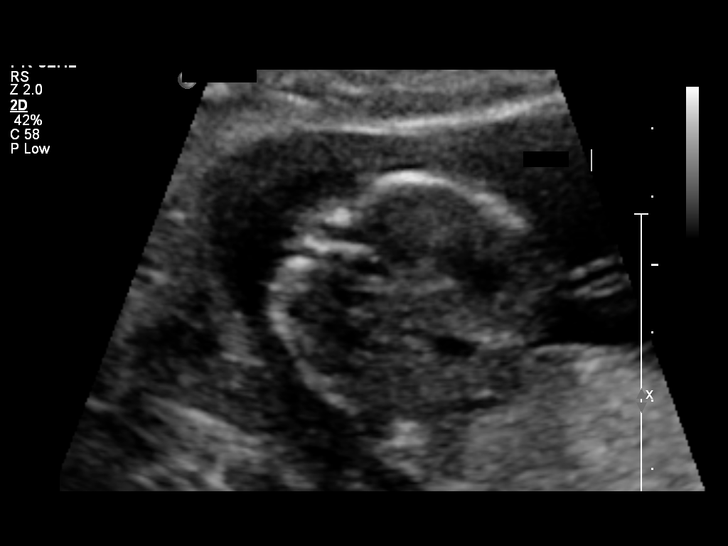
[im 33/73]
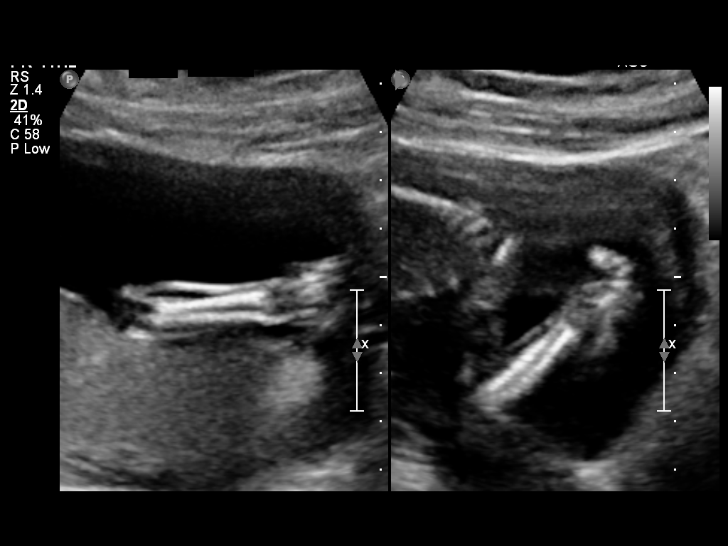
[im 41/73]
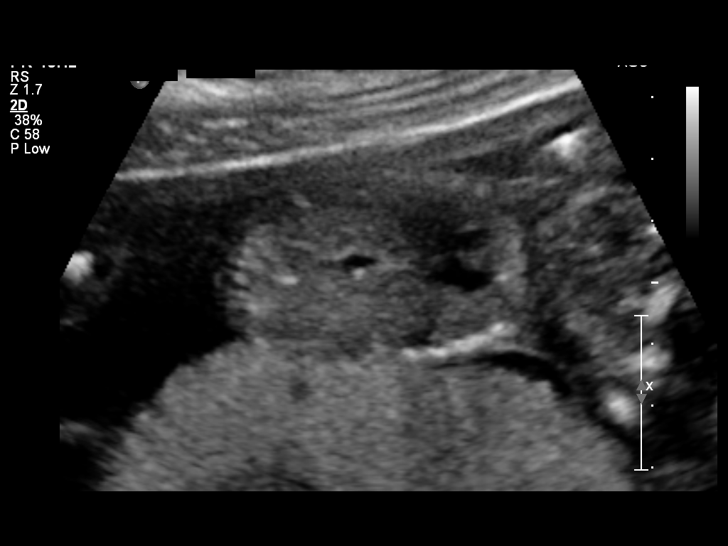
[im 46/73]
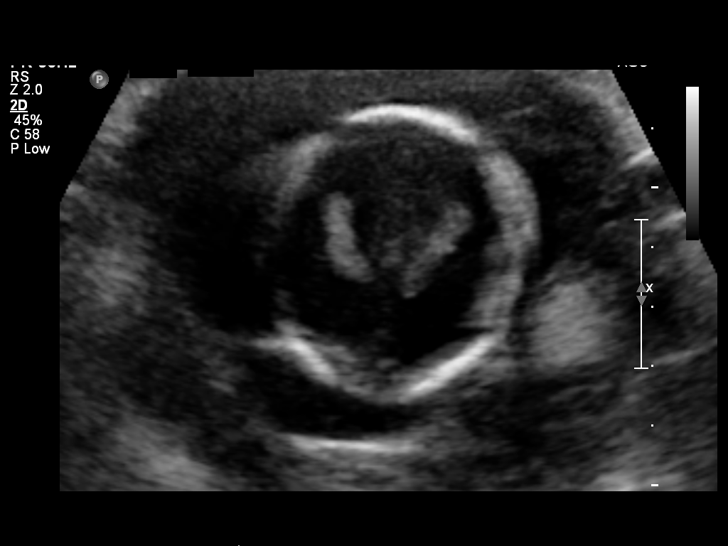
[im 51/73]
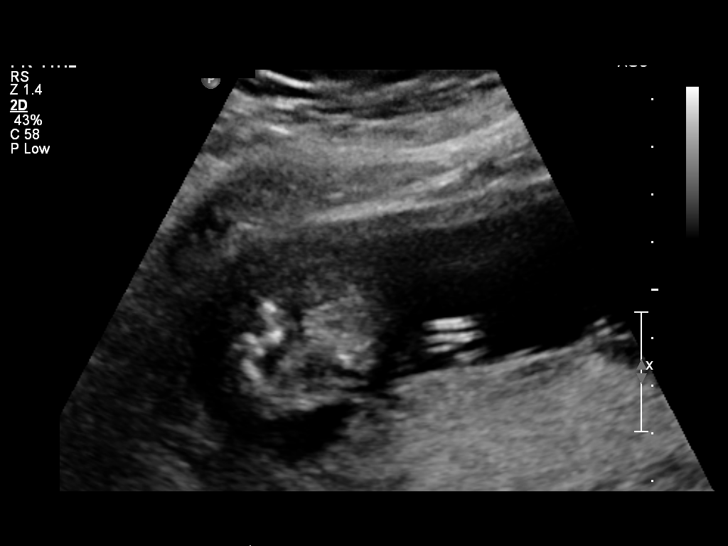
[im 59/73]
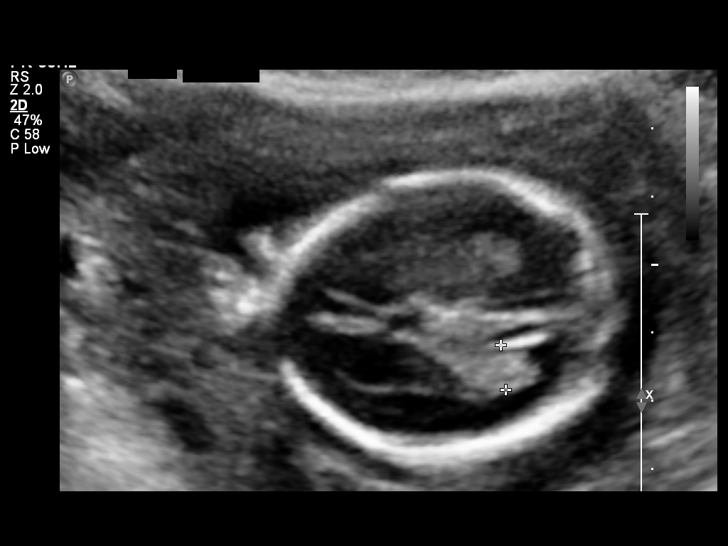
[im 65/73]
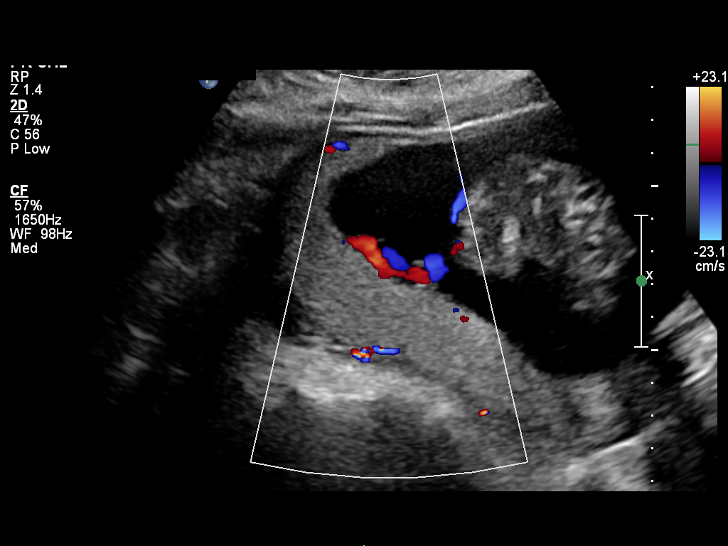
[im 70/73]
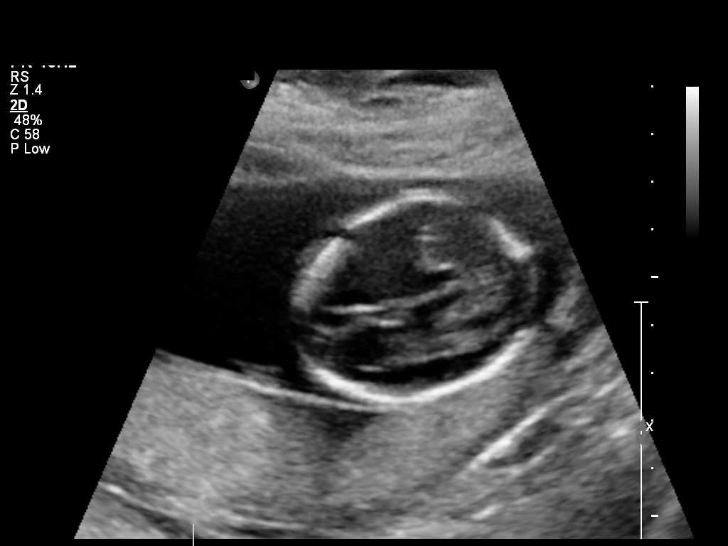

[12 of 28 positions shown; findings below may reference images not displayed]

OBSTETRICS REPORT
                      (Signed Final 09/24/2012 [DATE])

                                                         [REDACTED]-
                                                         Faculty Physician
Service(s) Provided

 US OB COMP + 14 WK                                    76805.1
Indications

 Basic anatomic survey
 No or Little Prenatal Care
Fetal Evaluation

 Num Of Fetuses:    1
 Fetal Heart Rate:  143                         bpm
 Cardiac Activity:  Observed
 Presentation:      Breech, footling
 Placenta:          Posterior, above cervical
                    os
 P. Cord            Visualized, central
 Insertion:

 Amniotic Fluid
 AFI FV:      Subjectively within normal limits
                                             Larg Pckt:   5.34   cm
Gestational Age

 LMP:           18w 0d       Date:   05/21/12                 EDD:   02/25/13
 Best:          18w 0d    Det. By:   LMP  (05/21/12)          EDD:   02/25/13
2nd Trimester Genetic Sonogram - Trisomy 21 Screening

 Age:                                             28          Risk=1:   641

 Structural anomalies (inc. cardiac):             N/A
 Echogenic bowel:                                 No
 Hypoplastic / absent midphalanx 5th Digit:       N/A
 Wide space 1st-7nd toes:                         N/A
 Pyelectasis:                                     No
 2-vessel umbilical cord:                         No
 Echogenic cardiac foci:                          No
Anatomy

 Cranium:          Appears normal         Aortic Arch:      Basic anatomy
                                                            exam per order
 Fetal Cavum:      Appears normal         Ductal Arch:      Basic anatomy
                                                            exam per order
 Ventricles:       Appears normal         Diaphragm:        Appears normal
 Choroid Plexus:   Appears normal         Stomach:          Appears normal
 Cerebellum:       Appears normal         Abdomen:          Appears normal
 Posterior Fossa:  Appears normal         Abdominal Wall:   Appears nml (cord
                                                            insert, abd wall)
 Nuchal Fold:      Appears normal         Cord Vessels:     Appears normal (3
                                                            vessel cord)
 Face:             Orbits appear          Kidneys:          Appear normal
                   normal
 Lips:             Appears normal         Bladder:          Appears normal
 Heart:            Not well visualized    Spine:            Appears normal
 RVOT:             Not well visualized    Lower             Appears normal
                                          Extremities:
 LVOT:             Not well visualized    Upper             Appears normal
                                          Extremities:

 Other:  Fetus appears to be a male. Technically difficult due to maternal
         habitus and fetal position.
Cervix Uterus Adnexa

 Cervical Length:   3.7       cm

 Cervix:       Normal appearance by transabdominal scan.
 Uterus:       No abnormality visualized.
 Cul De Sac:   No free fluid seen.

 Left Ovary:   Within normal limits.
 Right Ovary:  Not visualized.
 Adnexa:     No abnormality visualized.
Impression

 Single living IUP with assigned GA of 18w 0d. EGA by
 ultrasound is concordant.
 The visualized fetal anatomy appears normal.
 The fetal heart and outflow tracts are not well visualized.
 Normal amniotic fluid volume and cervical length.

 questions or concerns.
